# Patient Record
Sex: Female | Born: 1990
Health system: Southern US, Community
[De-identification: ages and names within clinical notes are randomized; demographics above are authoritative.]

## PROBLEM LIST (undated history)

## (undated) DIAGNOSIS — N390 Urinary tract infection, site not specified: Secondary | ICD-10-CM

## (undated) DIAGNOSIS — K589 Irritable bowel syndrome without diarrhea: Secondary | ICD-10-CM

## (undated) DIAGNOSIS — K219 Gastro-esophageal reflux disease without esophagitis: Secondary | ICD-10-CM

## (undated) DIAGNOSIS — F32A Depression, unspecified: Secondary | ICD-10-CM

## (undated) DIAGNOSIS — T7840XA Allergy, unspecified, initial encounter: Secondary | ICD-10-CM

## (undated) DIAGNOSIS — F419 Anxiety disorder, unspecified: Secondary | ICD-10-CM

## (undated) DIAGNOSIS — G43909 Migraine, unspecified, not intractable, without status migrainosus: Secondary | ICD-10-CM

## (undated) HISTORY — DX: Allergy, unspecified, initial encounter: T78.40XA

## (undated) HISTORY — DX: Irritable bowel syndrome, unspecified: K58.9

## (undated) HISTORY — DX: Urinary tract infection, site not specified: N39.0

## (undated) HISTORY — DX: Anxiety disorder, unspecified: F41.9

## (undated) HISTORY — DX: Gastro-esophageal reflux disease without esophagitis: K21.9

## (undated) HISTORY — DX: Migraine, unspecified, not intractable, without status migrainosus: G43.909

## (undated) HISTORY — DX: Depression, unspecified: F32.A

---
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
## 2012-08-23 ENCOUNTER — Other Ambulatory Visit (HOSPITAL_COMMUNITY): Payer: Self-pay | Admitting: Family Medicine

## 2012-08-23 ENCOUNTER — Ambulatory Visit (HOSPITAL_COMMUNITY)
Admission: RE | Admit: 2012-08-23 | Discharge: 2012-08-23 | Disposition: A | Discharge status: Home / Self Care | Payer: PRIVATE HEALTH INSURANCE | Source: Ambulatory Visit | Attending: Family Medicine | Admitting: Family Medicine

## 2012-08-23 DIAGNOSIS — IMO0002 Reserved for concepts with insufficient information to code with codable children: Secondary | ICD-10-CM | POA: Insufficient documentation

## 2012-08-23 DIAGNOSIS — R072 Precordial pain: Secondary | ICD-10-CM | POA: Insufficient documentation

## 2012-08-23 DIAGNOSIS — Y9323 Activity, snow (alpine) (downhill) skiing, snow boarding, sledding, tobogganing and snow tubing: Secondary | ICD-10-CM | POA: Insufficient documentation

## 2012-08-23 DIAGNOSIS — R52 Pain, unspecified: Secondary | ICD-10-CM

## 2012-08-23 DIAGNOSIS — S298XXA Other specified injuries of thorax, initial encounter: Secondary | ICD-10-CM | POA: Insufficient documentation

## 2012-10-21 ENCOUNTER — Ambulatory Visit: Payer: Self-pay | Admitting: Family Medicine

## 2012-10-22 ENCOUNTER — Encounter: Payer: Self-pay | Admitting: Family Medicine

## 2012-10-22 ENCOUNTER — Ambulatory Visit (INDEPENDENT_AMBULATORY_CARE_PROVIDER_SITE_OTHER): Payer: PRIVATE HEALTH INSURANCE | Admitting: Family Medicine

## 2012-10-22 VITALS — BP 110/68 | Temp 97.7°F | Wt 180.0 lb

## 2012-10-22 DIAGNOSIS — R300 Dysuria: Secondary | ICD-10-CM

## 2012-10-22 DIAGNOSIS — R21 Rash and other nonspecific skin eruption: Secondary | ICD-10-CM

## 2012-10-22 DIAGNOSIS — N390 Urinary tract infection, site not specified: Secondary | ICD-10-CM

## 2012-10-22 LAB — POCT URINALYSIS DIPSTICK: Spec Grav, UA: 1.02

## 2012-10-22 MED ORDER — CIPROFLOXACIN HCL 500 MG PO TABS: 500.0000 mg | ORAL_TABLET | Freq: Two times a day (BID) | ORAL | Status: AC

## 2012-10-22 MED ORDER — TRIAMCINOLONE ACETONIDE 0.1 % EX CREA: TOPICAL_CREAM | Freq: Two times a day (BID) | CUTANEOUS | Status: DC

## 2012-10-22 NOTE — Progress Notes (Signed)
  Subjective:    Patient ID: Brandi Oconnell, female    DOB: 04/01/1991, 22 y.o.   MRN: [id number]  Urinary Tract Infection  This is a new problem. The current episode started in the past 7 days. The problem occurs every urination. The problem has been gradually worsening. The quality of the pain is described as burning. The pain is at a severity of 7/10. The pain is severe. There has been no fever. Associated symptoms include frequency and hesitancy. Pertinent negatives include no chills or nausea. She has tried increased fluids and home medications for the symptoms. The treatment provided mild relief.   Patient also notes rash. Very pruritic. Comes up as tiny bumps on the fingers. Unresponsive to over-the-counter agents.   Review of Systems  Constitutional: Negative for chills.  Gastrointestinal: Negative for nausea.  Genitourinary: Positive for hesitancy and frequency.   ROS otherwise negative.    Objective:   Physical Exam  Alert no acute distress. Lungs clear. Heart regular in rhythm. No CVA tenderness. Hands positive dyshidrotic eczema changes  UA 4-6 white blood cells per high-power field    Assessment & Plan:  Impression #1 UTI. #2 dyshidrotic eczema. Plan Cipro twice a day for 7 days. Triamcinolone cream twice a day to affected area. Symptomatic care discussed. WSL

## 2012-11-04 HISTORY — PX: WISDOM TOOTH EXTRACTION: SHX21

## 2012-12-02 ENCOUNTER — Encounter: Payer: Self-pay | Admitting: Family Medicine

## 2012-12-02 ENCOUNTER — Ambulatory Visit (INDEPENDENT_AMBULATORY_CARE_PROVIDER_SITE_OTHER): Payer: PRIVATE HEALTH INSURANCE | Admitting: Family Medicine

## 2012-12-02 VITALS — BP 102/70 | Temp 98.4°F | Ht 65.0 in | Wt 177.2 lb

## 2012-12-02 DIAGNOSIS — K2970 Gastritis, unspecified, without bleeding: Secondary | ICD-10-CM | POA: Insufficient documentation

## 2012-12-02 DIAGNOSIS — K2990 Gastroduodenitis, unspecified, without bleeding: Secondary | ICD-10-CM

## 2012-12-02 DIAGNOSIS — R1013 Epigastric pain: Secondary | ICD-10-CM

## 2012-12-02 MED ORDER — PANTOPRAZOLE SODIUM 40 MG PO TBEC: 40.0000 mg | DELAYED_RELEASE_TABLET | Freq: Every day | ORAL | Status: DC

## 2012-12-02 NOTE — Progress Notes (Signed)
  Subjective:    Patient ID: Brandi Oconnell, female    DOB: 05-25-1991, 22 y.o.   MRN: [id number]  Abdominal Pain This is a new problem. The current episode started in the past 7 days. The onset quality is sudden. The problem occurs intermittently. The problem has been gradually worsening. The pain is located in the generalized abdominal region. The pain is at a severity of 10/10. The pain is severe. The quality of the pain is aching and sharp. The abdominal pain does not radiate. Associated symptoms include nausea. Pertinent negatives include no fever or vomiting. Nothing aggravates the pain. The pain is relieved by nothing. She has tried nothing for the symptoms. past history of stomach ulcer   Weds am awaken form sleep. Severe pain, kept her awake. Pain throughout the day, worse by 4 pm.  Had nausea.  Review of Systems  Constitutional: Negative for fever and fatigue.  HENT: Negative for congestion.   Respiratory: Negative for choking.   Gastrointestinal: Positive for nausea and abdominal pain. Negative for vomiting.  nsaids rarely.      Objective:   Physical Exam  Constitutional: She appears well-developed.  HENT:  Head: Normocephalic.  Cardiovascular: Normal rate, regular rhythm and normal heart sounds.   Pulmonary/Chest: Effort normal and breath sounds normal.  Abdominal: Soft. She exhibits no mass. There is tenderness (epigastric). There is no rebound and no guarding.  Lymphadenopathy:    She has no cervical adenopathy.          Assessment & Plan:  Gastritis- check labs, protonix 8 weeks, if worse f/u, EGD if worse

## 2012-12-02 NOTE — Patient Instructions (Signed)
  Do your lab work Take the protonix one daily for 2 months If worse need to let us know    Gastritis, Adult Gastritis is soreness and swelling (inflammation) of the lining of the stomach. Gastritis can develop as a sudden onset (acute) or long-term (chronic) condition. If gastritis is not treated, it can lead to stomach bleeding and ulcers. CAUSES  Gastritis occurs when the stomach lining is weak or damaged. Digestive juices from the stomach then inflame the weakened stomach lining. The stomach lining may be weak or damaged due to viral or bacterial infections. One common bacterial infection is the Helicobacter pylori infection. Gastritis can also result from excessive alcohol consumption, taking certain medicines, or having too much acid in the stomach.  SYMPTOMS  In some cases, there are no symptoms. When symptoms are present, they may include:  Pain or a burning sensation in the upper abdomen.  Nausea.  Vomiting.  An uncomfortable feeling of fullness after eating. DIAGNOSIS  Your caregiver may suspect you have gastritis based on your symptoms and a physical exam. To determine the cause of your gastritis, your caregiver may perform the following:  Blood or stool tests to check for the H pylori bacterium.  Gastroscopy. A thin, flexible tube (endoscope) is passed down the esophagus and into the stomach. The endoscope has a light and camera on the end. Your caregiver uses the endoscope to view the inside of the stomach.  Taking a tissue sample (biopsy) from the stomach to examine under a microscope. TREATMENT  Depending on the cause of your gastritis, medicines may be prescribed. If you have a bacterial infection, such as an H pylori infection, antibiotics may be given. If your gastritis is caused by too much acid in the stomach, H2 blockers or antacids may be given. Your caregiver may recommend that you stop taking aspirin, ibuprofen, or other nonsteroidal anti-inflammatory drugs  (NSAIDs). HOME CARE INSTRUCTIONS  Only take over-the-counter or prescription medicines as directed by your caregiver.  If you were given antibiotic medicines, take them as directed. Finish them even if you start to feel better.  Drink enough fluids to keep your urine clear or pale yellow.  Avoid foods and drinks that make your symptoms worse, such as:  Caffeine or alcoholic drinks.  Chocolate.  Peppermint or mint flavorings.  Garlic and onions.  Spicy foods.  Citrus fruits, such as oranges, lemons, or limes.  Tomato-based foods such as sauce, chili, salsa, and pizza.  Fried and fatty foods.  Eat small, frequent meals instead of large meals. SEEK IMMEDIATE MEDICAL CARE IF:   You have black or dark red stools.  You vomit blood or material that looks like coffee grounds.  You are unable to keep fluids down.  Your abdominal pain gets worse.  You have a fever.  You do not feel better after 1 week.  You have any other questions or concerns. MAKE SURE YOU:  Understand these instructions.  Will watch your condition.  Will get help right away if you are not doing well or get worse. Document Released: 06/17/2001 Document Revised: 12/23/2011 Document Reviewed: 08/06/2011 [facility] Patient Information 2014 [place], [place].

## 2012-12-09 LAB — CBC WITH DIFFERENTIAL/PLATELET
Eosinophils Absolute: 0.5 10*3/uL (ref 0.0–0.7)
Eosinophils Relative: 6 % — ABNORMAL HIGH (ref 0–5)
HCT: 43.1 % (ref 36.0–46.0)
Hemoglobin: 14.9 g/dL (ref 12.0–15.0)
Lymphocytes Relative: 27 % (ref 12–46)
Lymphs Abs: 2.2 10*3/uL (ref 0.7–4.0)
MCH: 29 pg (ref 26.0–34.0)
MCV: 83.9 fL (ref 78.0–100.0)
Monocytes Absolute: 0.9 10*3/uL (ref 0.1–1.0)
Monocytes Relative: 11 % (ref 3–12)
Platelets: 340 10*3/uL (ref 150–400)
RBC: 5.14 MIL/uL — ABNORMAL HIGH (ref 3.87–5.11)

## 2012-12-09 LAB — HEPATIC FUNCTION PANEL
ALT: 31 U/L (ref 0–35)
AST: 20 U/L (ref 0–37)
Alkaline Phosphatase: 74 U/L (ref 39–117)
Bilirubin, Direct: 0.1 mg/dL (ref 0.0–0.3)
Indirect Bilirubin: 0.4 mg/dL (ref 0.0–0.9)
Total Protein: 6.8 g/dL (ref 6.0–8.3)

## 2013-04-27 ENCOUNTER — Ambulatory Visit (INDEPENDENT_AMBULATORY_CARE_PROVIDER_SITE_OTHER): Payer: PRIVATE HEALTH INSURANCE | Admitting: Advanced Practice Midwife

## 2013-04-27 ENCOUNTER — Encounter: Payer: Self-pay | Admitting: Advanced Practice Midwife

## 2013-04-27 ENCOUNTER — Encounter (INDEPENDENT_AMBULATORY_CARE_PROVIDER_SITE_OTHER): Payer: Self-pay

## 2013-04-27 VITALS — BP 112/84 | Ht 65.0 in | Wt 179.0 lb

## 2013-04-27 DIAGNOSIS — Z3202 Encounter for pregnancy test, result negative: Secondary | ICD-10-CM

## 2013-04-27 DIAGNOSIS — Z3049 Encounter for surveillance of other contraceptives: Secondary | ICD-10-CM

## 2013-04-27 DIAGNOSIS — Z309 Encounter for contraceptive management, unspecified: Secondary | ICD-10-CM | POA: Insufficient documentation

## 2013-04-27 NOTE — Progress Notes (Signed)
Filed Vitals:   04/27/13 1445  BP: 112/84  Has been on Seasonique for 3 years.  Has been having BTB over the past few months, no rhyme or reason. Does not miss any pills. Discussed options.  Will stay on Seasonique, but schedule a BTB every month ( 4 days off q 24) for 3 months.  If this does not help, consider increase her estrogen.  Pt is agreeable.

## 2013-07-04 ENCOUNTER — Other Ambulatory Visit: Payer: Self-pay | Admitting: Family Medicine

## 2013-07-14 ENCOUNTER — Ambulatory Visit (INDEPENDENT_AMBULATORY_CARE_PROVIDER_SITE_OTHER): Payer: PRIVATE HEALTH INSURANCE | Admitting: Nurse Practitioner

## 2013-07-14 ENCOUNTER — Encounter: Payer: Self-pay | Admitting: Nurse Practitioner

## 2013-07-14 VITALS — BP 122/70 | Temp 98.5°F | Ht 65.75 in | Wt 185.0 lb

## 2013-07-14 DIAGNOSIS — K5904 Chronic idiopathic constipation: Secondary | ICD-10-CM

## 2013-07-14 DIAGNOSIS — K5900 Constipation, unspecified: Secondary | ICD-10-CM

## 2013-07-14 MED ORDER — LUBIPROSTONE 24 MCG PO CAPS: 24.0000 ug | ORAL_CAPSULE | Freq: Two times a day (BID) | ORAL | Status: DC

## 2013-07-15 ENCOUNTER — Encounter: Payer: Self-pay | Admitting: Nurse Practitioner

## 2013-07-15 DIAGNOSIS — K5904 Chronic idiopathic constipation: Secondary | ICD-10-CM | POA: Insufficient documentation

## 2013-07-15 NOTE — Assessment & Plan Note (Signed)
Restart Amitiza 24 mcg twice a day. Increase fiber in diet. Adequate fluid intake. Reviewed plan in case severe constipation occurs. Warning signs reviewed. Call back if symptoms worsen or persist.

## 2013-07-15 NOTE — Progress Notes (Signed)
Subjective:  Presents with complaints of a flareup of her constipation over the past month. Has a history of chronic constipation which will worsen intermittently. Takes daily probiotic. Reflux well controlled with Protonix. No nausea or vomiting. Took Amitiza in 2012 which worked well. Her last BM was 3 days ago, small soft stool. Has cramping and bloating only with bowel movements. Otherwise no abdominal pain. Rare bright red rectal bleeding with wiping only with large stools. No fevers. No urinary symptoms. Has trouble getting in adequate fiber to her diet. Has not identified any specific triggers.  Objective:   BP 122/70  Temp(Src) 98.5 F (36.9 C) (Oral)  Ht 5' 5.75" (1.67 m)  Wt 185 lb (83.915 kg)  BMI 30.09 kg/m2 NAD. Alert, oriented. Lungs clear. Heart regular rate rhythm. Abdomen mildly distended with hypoactive bowel sounds x4; nontender to palpation. No obvious masses.  Assessment:Chronic idiopathic constipation  Plan: Restart Amitiza 24 mcg twice a day. Increase fiber in diet. Adequate fluid intake. Reviewed plan in case severe constipation occurs. Warning signs reviewed. Call back if symptoms worsen or persist.

## 2013-08-18 ENCOUNTER — Telehealth: Payer: Self-pay | Admitting: Family Medicine

## 2013-08-18 NOTE — Telephone Encounter (Signed)
Stop the amitiza (given for constip)and start high dose miralax one scoop bid for constip

## 2013-08-18 NOTE — Telephone Encounter (Signed)
Patient says that she thinks that the Amitiza that she was prescribed for her migraines is causing her to have them more frequently. Please advise.

## 2013-08-18 NOTE — Telephone Encounter (Signed)
Notified patient stop the amitiza (given for constip) and start high dose miralax one scoop bid for constip. Patient verbalized understanding.

## 2013-08-22 ENCOUNTER — Telehealth: Payer: Self-pay | Admitting: Family Medicine

## 2013-08-22 NOTE — Telephone Encounter (Signed)
miralax can be taken safely 365 days per yr in someone prone to constipation. They write that so folks who have a serious cause (extremely rare at this age) do not keep self treating without contacting their physician  Get otc dulcolax tablets and take four this eve  Then get Milk of magnesia and take 30 cc's by mouth,..may repeat both of these tomorrow if necessary  If constip persists, rec o v

## 2013-08-22 NOTE — Telephone Encounter (Signed)
Patient was told to take Miralax for her constipation last week. She has concerns on this. She has not had a bowel movement since Friday. The back of the bottle states do not take more than 7 days. Please advise.

## 2013-08-22 NOTE — Telephone Encounter (Signed)
Discussed with patient. Patient verbalized understanding. 

## 2013-08-26 ENCOUNTER — Encounter: Payer: Self-pay | Admitting: Family Medicine

## 2013-08-26 ENCOUNTER — Ambulatory Visit (INDEPENDENT_AMBULATORY_CARE_PROVIDER_SITE_OTHER): Payer: PRIVATE HEALTH INSURANCE | Admitting: Family Medicine

## 2013-08-26 VITALS — BP 120/80 | Temp 98.4°F | Ht 65.75 in | Wt 189.4 lb

## 2013-08-26 DIAGNOSIS — K5904 Chronic idiopathic constipation: Secondary | ICD-10-CM

## 2013-08-26 DIAGNOSIS — K219 Gastro-esophageal reflux disease without esophagitis: Secondary | ICD-10-CM

## 2013-08-26 DIAGNOSIS — K5900 Constipation, unspecified: Secondary | ICD-10-CM

## 2013-08-26 MED ORDER — HYOSCYAMINE SULFATE 0.125 MG SL SUBL: 0.1250 mg | SUBLINGUAL_TABLET | Freq: Four times a day (QID) | SUBLINGUAL | Status: DC | PRN

## 2013-08-26 MED ORDER — SUCRALFATE 1 G PO TABS: ORAL_TABLET | ORAL | Status: DC

## 2013-08-26 NOTE — Patient Instructions (Signed)
Double up on the miralax for one weeks

## 2013-08-26 NOTE — Progress Notes (Signed)
   Subjective:    Patient ID: Brandi LeechKristin E Oconnell, female    DOB: 10/01/1990, 23 y.o.   MRN: [id number]  Abdominal Pain This is a new problem. The current episode started today. The onset quality is sudden. The problem occurs intermittently. The problem has been unchanged. The pain is located in the generalized abdominal region. The pain is at a severity of 8/10. The pain is moderate. The quality of the pain is cramping. The abdominal pain does not radiate. Nothing aggravates the pain. The pain is relieved by nothing. She has tried proton pump inhibitors for the symptoms. The treatment provided no relief.   Patient has also had some reflux symptoms. At times burning and belching. Positive history of reflux in the past. Has not had her see a gastroenterologist.  Experiencing cramping. See prior notes. Associated with protracted constipation and only diarrhea in the last couple days. amitiza worsened the constip  Review of Systems  Gastrointestinal: Positive for abdominal pain.   no fever no chills no change in urinary habits ROS otherwise negative     Objective:   Physical Exam Alert no acute distress. Vitals stable. Lungs clear. Heart rare rhythm. Epigastrium perhaps slight tenderness no rebound no guarding diffuse mild tenderness very good bowel sounds       Assessment & Plan:  Impression 1 flare of reflux. #2 chronic constipation. Plan increase MiraLAX once again. Patient may have upper: Constipation with loose stools discussed. Plan increase MiraLAX to twice a day. Levsin SL when necessary for cramping. Symptomatic care discussed. Add Carafate a.c. and at bedtime next couple weeks. Expect slow resolution.

## 2013-08-27 DIAGNOSIS — K219 Gastro-esophageal reflux disease without esophagitis: Secondary | ICD-10-CM | POA: Insufficient documentation

## 2013-09-20 ENCOUNTER — Ambulatory Visit: Payer: PRIVATE HEALTH INSURANCE | Admitting: Family Medicine

## 2013-09-26 IMAGING — CR DG CHEST 2V
2 series · 2 of 2 positions shown · non-contrast
Comparison: 12/25/2011

CLINICAL DATA: Sledding accident.  Sternal pain

CHEST - 2 VIEW

[view not recorded (1 of 2)]
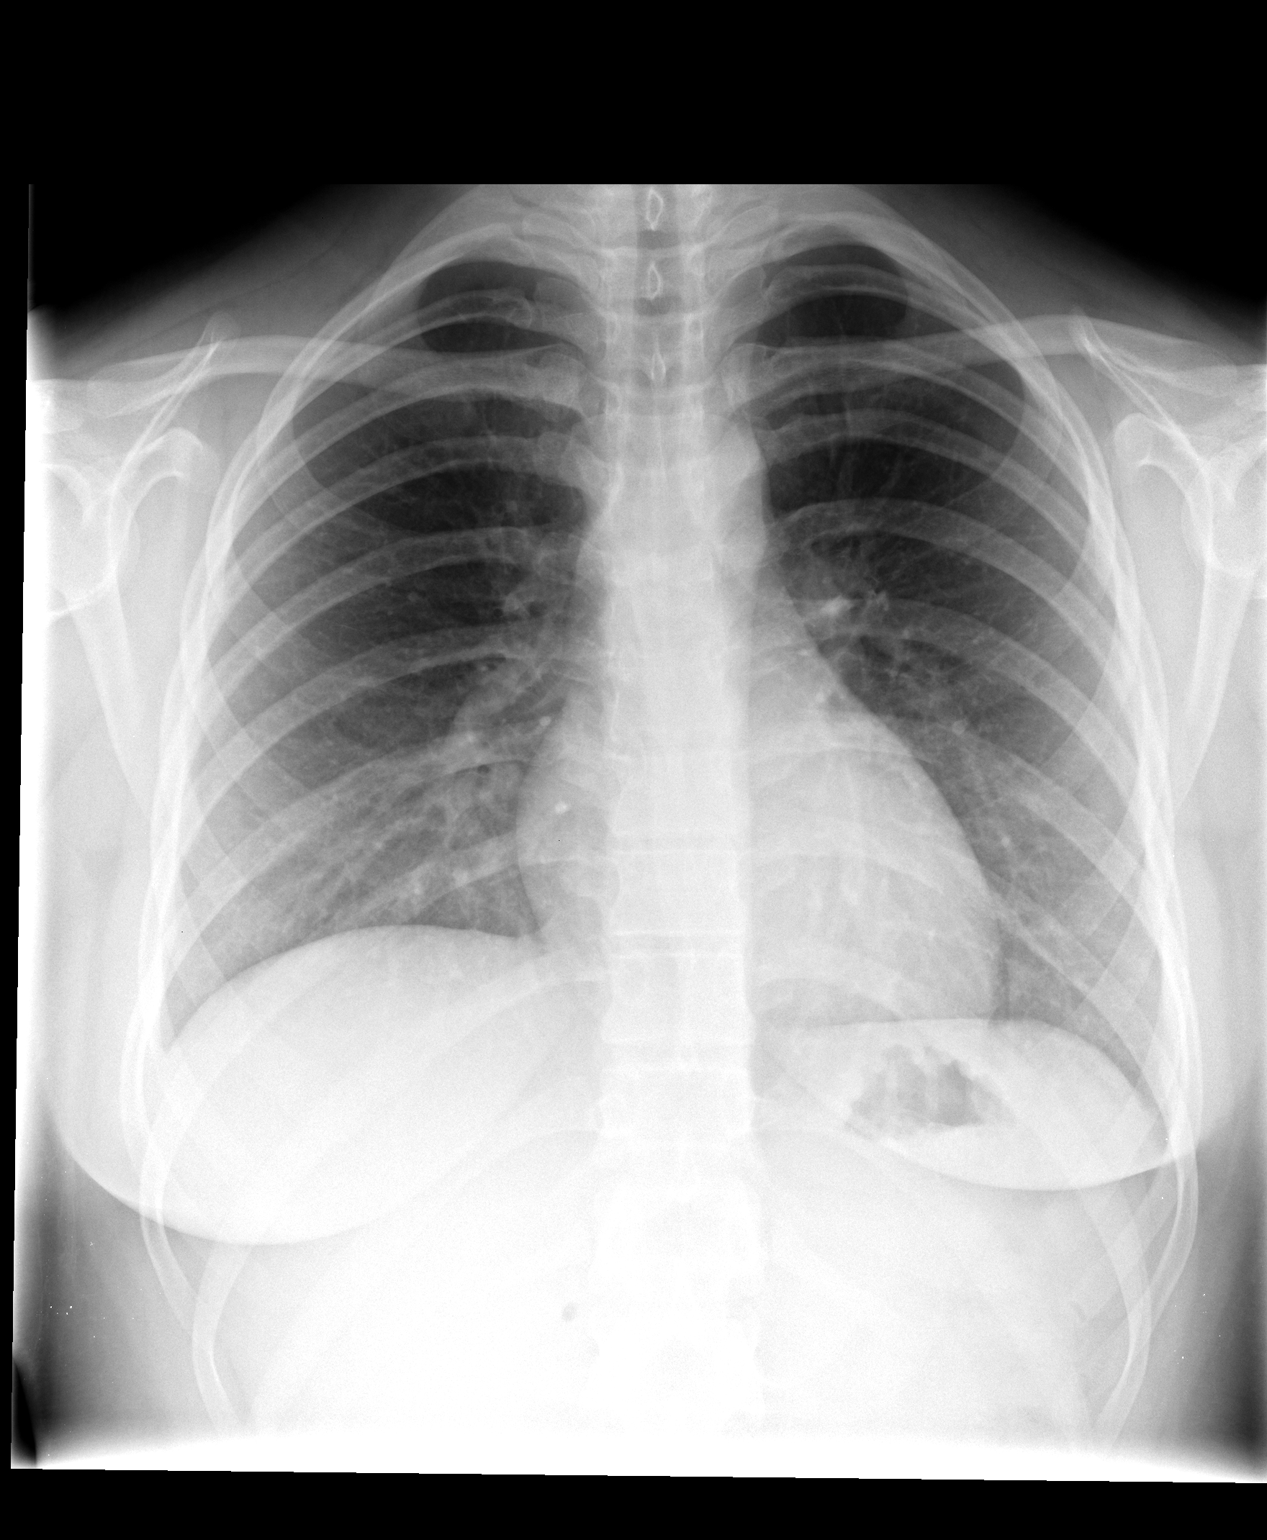

[view not recorded (2 of 2)]
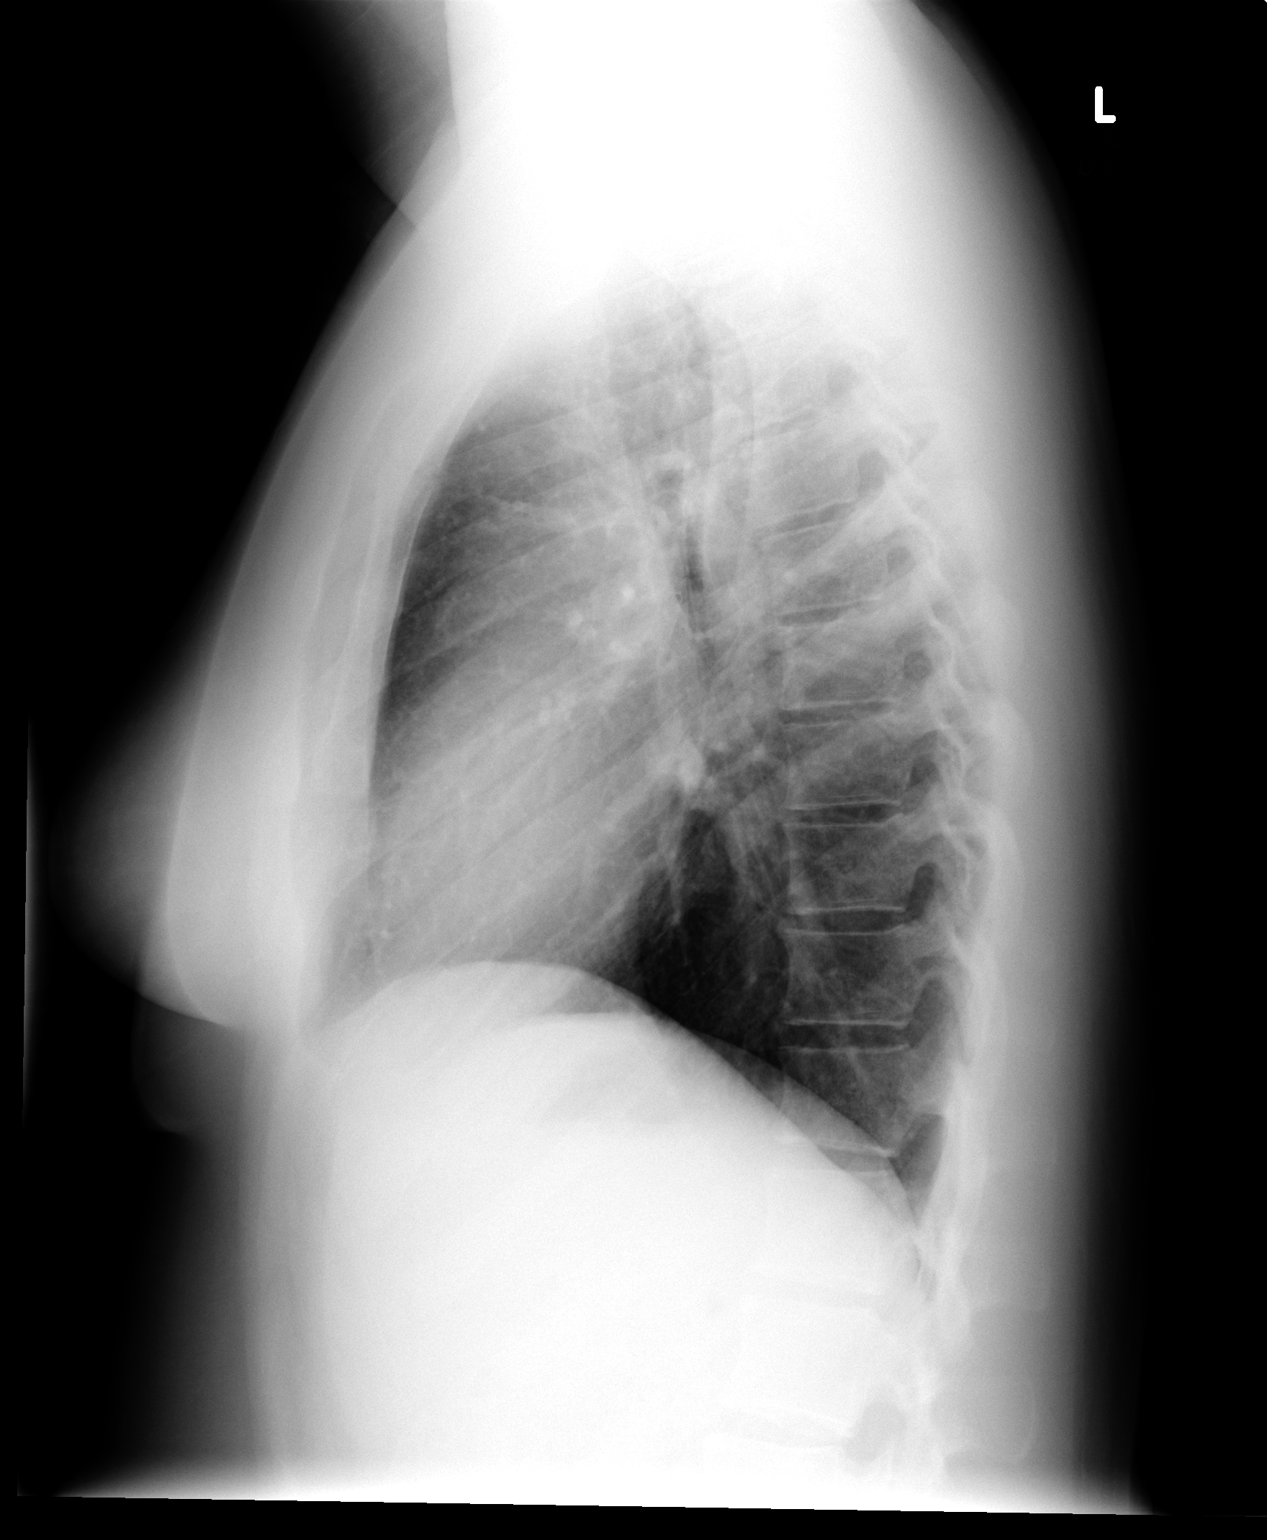

[2 of 2 positions shown; findings below may reference images not displayed]

FINDINGS: Cardiac and mediastinal contours are normal.  Lungs are
clear without infiltrate or effusion.  Negative for pneumothorax.

Negative for rib fracture.  No displaced sternal fracture is
identified.
IMPRESSION: Negative

## 2014-03-20 ENCOUNTER — Encounter: Payer: Self-pay | Admitting: Family Medicine

## 2014-03-20 ENCOUNTER — Ambulatory Visit (INDEPENDENT_AMBULATORY_CARE_PROVIDER_SITE_OTHER): Payer: PRIVATE HEALTH INSURANCE | Admitting: Family Medicine

## 2014-03-20 VITALS — BP 124/80 | Temp 99.2°F | Ht 65.75 in | Wt 188.0 lb

## 2014-03-20 DIAGNOSIS — N390 Urinary tract infection, site not specified: Secondary | ICD-10-CM

## 2014-03-20 LAB — POCT URINALYSIS DIPSTICK: pH, UA: 6

## 2014-03-20 MED ORDER — CIPROFLOXACIN HCL 250 MG PO TABS: ORAL_TABLET | ORAL | Status: DC

## 2014-03-20 NOTE — Patient Instructions (Signed)
Take one of the cipro 250 twice per day for five days for the active infxn  Take one cipro as needed when sexually active , and be sure to urinate before having sex

## 2014-03-20 NOTE — Progress Notes (Signed)
   Subjective:    Patient ID: Brandi Oconnell, female    DOB: July 13, 1990, 23 y.o.   MRN: [id number]  Urinary Tract Infection  This is a new problem. The current episode started in the past 7 days. The problem occurs intermittently. The quality of the pain is described as burning. The pain is at a severity of 1/10. The pain is mild. Maximum temperature: 99.2. The fever has been present for 3 - 4 days. She is sexually active. She has tried home medications and increased fluids for the symptoms. The treatment provided no relief.   Friday stared  Suddenly  Took otc azo and took extra cran juice and vit c  Low gr temp, felt flushed all nweekend,  diminshed energy  Seems to increase after having sex  Empties bladder bef having sex  Low abd tenderness, now     Review of Systems No headache no chest pain no fever no chills ROS otherwise negative    Objective:   Physical Exam Alert no acute distress. Lungs clear. Heart rare rhythm no CA tenderness  Urinalysis 2-4 white blood cells per high-power field       Assessment & Plan:  Impression urinary tract infection. Plan Cipro 250 twice a day 5 days. May take one by mouth when necessary when having sex to reduce incidence of UTI discussed. Also be sure it's empty bladder before sexual relations. WSL

## 2014-04-17 ENCOUNTER — Telehealth: Payer: Self-pay | Admitting: Family Medicine

## 2014-04-17 ENCOUNTER — Encounter: Payer: Self-pay | Admitting: Family Medicine

## 2014-04-17 ENCOUNTER — Ambulatory Visit (INDEPENDENT_AMBULATORY_CARE_PROVIDER_SITE_OTHER): Payer: PRIVATE HEALTH INSURANCE | Admitting: Family Medicine

## 2014-04-17 VITALS — Temp 100.0°F | Ht 65.75 in | Wt 187.0 lb

## 2014-04-17 DIAGNOSIS — J329 Chronic sinusitis, unspecified: Secondary | ICD-10-CM

## 2014-04-17 MED ORDER — BENZONATATE 100 MG PO CAPS: 100.0000 mg | ORAL_CAPSULE | Freq: Four times a day (QID) | ORAL | Status: DC | PRN

## 2014-04-17 MED ORDER — AZITHROMYCIN 250 MG PO TABS: ORAL_TABLET | ORAL | Status: DC

## 2014-04-17 NOTE — Progress Notes (Signed)
   Subjective:    Patient ID: Brandi Oconnell, female    DOB: 12/13/1990, 23 y.o.   MRN: [id number]  Fever  This is a new problem. The current episode started today. Associated symptoms include congestion, coughing, headaches and muscle aches. She has tried NSAIDs for the symptoms.    Sudden onset  Diffuse achiness  Just yest exp to some sick folks   Working at [employer]    Felt congested not true wheezing     Review of Systems  Constitutional: Positive for fever.  HENT: Positive for congestion.   Respiratory: Positive for cough.   Neurological: Positive for headaches.       Objective:   Physical Exam  Alert moderate malaise. Temperature 101. Vitals otherwise stable. HEENT nasal congestion pharynx moderate erythema neck supple. Lungs bronchial cough heart regular in rhythm.      Assessment & Plan:  Impression viral syndrome similar to parainfluenza with secondary rhinosinusitis plan antibiotics prescribed. Symptomatic care discussed. Tessalon when necessary. WSL

## 2014-04-17 NOTE — Telephone Encounter (Signed)
error 

## 2014-05-19 ENCOUNTER — Telehealth: Payer: Self-pay | Admitting: Family Medicine

## 2014-05-19 ENCOUNTER — Other Ambulatory Visit: Payer: Self-pay | Admitting: Nurse Practitioner

## 2014-05-19 MED ORDER — TRIAMCINOLONE ACETONIDE 0.1 % EX CREA: TOPICAL_CREAM | Freq: Two times a day (BID) | CUTANEOUS | Status: DC

## 2014-05-19 NOTE — Telephone Encounter (Signed)
Patient needs Rx for triamcinolone cream (KENALOG) 0.1 %   [facility]new pharmacy

## 2014-06-15 ENCOUNTER — Ambulatory Visit: Payer: PRIVATE HEALTH INSURANCE | Admitting: Family Medicine

## 2014-07-18 ENCOUNTER — Ambulatory Visit (INDEPENDENT_AMBULATORY_CARE_PROVIDER_SITE_OTHER): Payer: PRIVATE HEALTH INSURANCE | Admitting: Family Medicine

## 2014-07-18 ENCOUNTER — Encounter: Payer: Self-pay | Admitting: Family Medicine

## 2014-07-18 VITALS — BP 128/90 | Ht 65.0 in | Wt 193.0 lb

## 2014-07-18 DIAGNOSIS — M79602 Pain in left arm: Secondary | ICD-10-CM

## 2014-07-18 DIAGNOSIS — K219 Gastro-esophageal reflux disease without esophagitis: Secondary | ICD-10-CM

## 2014-07-18 MED ORDER — RANITIDINE HCL 300 MG PO TABS: 300.0000 mg | ORAL_TABLET | Freq: Every day | ORAL | Status: DC

## 2014-07-18 NOTE — Progress Notes (Signed)
   Subjective:    Patient ID: Brandi LeechKristin E Oconnell, female    DOB: 05/14/1991, 24 y.o.   MRN: [id number]  Wrist Pain  The pain is present in the left wrist, left shoulder, left arm and left elbow. This is a new problem. The current episode started more than 1 month ago. The quality of the pain is described as burning. Associated symptoms include tingling. She has tried NSAIDS for the symptoms.   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       Objective:   Physical Exam Alert no apparent distress. Vitals stable lungs clear. Heart regular in rhythm. Left shoulder excellent range of motion. Negative Tinel sign negative Phalen sign. Abdomen benign.       Assessment & Plan:  Impression progressive reflux discussed #2 nonspecific left arm pain. Likely related to redundant activity at work. Plan encouraged to follow-up with work comp doctor regarding this. Anti-inflammatory medicine as directed. Initiate Protonix. 4. Reflux. WSL

## 2014-08-15 ENCOUNTER — Encounter: Payer: Self-pay | Admitting: Family Medicine

## 2014-08-15 ENCOUNTER — Ambulatory Visit (INDEPENDENT_AMBULATORY_CARE_PROVIDER_SITE_OTHER): Payer: PRIVATE HEALTH INSURANCE | Admitting: Family Medicine

## 2014-08-15 VITALS — BP 122/80 | Ht 65.0 in | Wt 194.0 lb

## 2014-08-15 DIAGNOSIS — M25561 Pain in right knee: Secondary | ICD-10-CM

## 2014-08-15 DIAGNOSIS — M25562 Pain in left knee: Secondary | ICD-10-CM

## 2014-08-15 MED ORDER — ETODOLAC 400 MG PO TABS: 400.0000 mg | ORAL_TABLET | Freq: Two times a day (BID) | ORAL | Status: DC

## 2014-08-15 NOTE — Progress Notes (Signed)
   Subjective:    Patient ID: Brandi Oconnell, female    DOB: 04/22/1991, 24 y.o.   MRN: [id number]  Knee Pain  The incident occurred 3 to 5 days ago. The pain is present in the left knee and right knee. She has tried rest (anti inflammatory) for the symptoms. The treatment provided no relief.    Pt was at work Saturday and knees got to hurting bad  Felt tight and uncomfortable  Excuse given sat and sun, recalls no activity , no sig crouching  Knees have acted up in the past, 2013 cortsone injec in one knees, management of discomfort  Pain is a lot better, took aleave on sat and that felt better  Review of Systems No headache no chest pain no back pain no abdominal pain no headache pain reflux now improved    Objective:   Physical Exam  Alert vital stable no acute distress lungs clear heart rare rhythm anterior knee slight tenderness deep palpation. No effusion no joint laxity good range of motion      Assessment & Plan:  Impression bilateral knee pain prepatellar in nature discussed plan Lodine with food 3 times a day when necessary. Work excuse written. Local measures discussed. No x-rays rationale discussed. WSL

## 2014-09-06 ENCOUNTER — Other Ambulatory Visit: Payer: Self-pay | Admitting: *Deleted

## 2014-09-06 ENCOUNTER — Telehealth: Payer: Self-pay | Admitting: Family Medicine

## 2014-09-06 MED ORDER — LEVONORGEST-ETH ESTRAD 91-DAY 0.15-0.03 &0.01 MG PO TABS: 1.0000 | ORAL_TABLET | Freq: Every day | ORAL | Status: DC

## 2014-09-06 NOTE — Telephone Encounter (Signed)
Pt is needing a [facility] on [facility] [place]

## 2014-09-06 NOTE — Telephone Encounter (Signed)
Notified patient that she needs a well woman exam. She has not had one in over 2 years. Pt said she will call and schedule one with either Eber Jonesarolyn or her OBGYN once she calls her insurance co. I told her I will [facility] but that we cannot refill anymore w/o her having an exam. Pt verbalized understanding.

## 2014-09-26 ENCOUNTER — Other Ambulatory Visit: Payer: PRIVATE HEALTH INSURANCE | Admitting: Advanced Practice Midwife

## 2015-04-17 ENCOUNTER — Telehealth: Payer: Self-pay | Admitting: Family Medicine

## 2015-04-17 NOTE — Telephone Encounter (Signed)
Discussed with patient

## 2015-04-17 NOTE — Telephone Encounter (Signed)
Pt over took her pills on Tuesday (forgot she took it when she got home, took again when she went to bed, She took 3 pills that night total)  By Thursday she started feeling nauseated, an spotting started yesterday afternoon. 04/16/15  She wasn't to know if she should start taking the pill as normal? She stopped taking it for now and is confused  As to what to do? She is not due for her period till the 26th of this month. Thinks she may be pregnant an unsure  She should take the pill till she knows.    [facility]

## 2015-04-17 NOTE — Telephone Encounter (Signed)
She states she took two pills(after sex with spouse that night) because she read on the Internet that It is the equivalent to Plan B pill.  She forgot she had taken the one pill When she got home.

## 2015-09-11 ENCOUNTER — Encounter: Payer: PRIVATE HEALTH INSURANCE | Admitting: Nurse Practitioner

## 2015-09-13 ENCOUNTER — Ambulatory Visit (INDEPENDENT_AMBULATORY_CARE_PROVIDER_SITE_OTHER): Payer: PRIVATE HEALTH INSURANCE | Admitting: Nurse Practitioner

## 2015-09-13 ENCOUNTER — Encounter: Payer: Self-pay | Admitting: Nurse Practitioner

## 2015-09-13 VITALS — BP 110/62 | Ht 65.0 in | Wt 204.0 lb

## 2015-09-13 DIAGNOSIS — Z124 Encounter for screening for malignant neoplasm of cervix: Secondary | ICD-10-CM | POA: Diagnosis not present

## 2015-09-13 DIAGNOSIS — Z0000 Encounter for general adult medical examination without abnormal findings: Secondary | ICD-10-CM

## 2015-09-13 DIAGNOSIS — Z113 Encounter for screening for infections with a predominantly sexual mode of transmission: Secondary | ICD-10-CM | POA: Diagnosis not present

## 2015-09-13 MED ORDER — LEVONORGEST-ETH ESTRAD 91-DAY 0.15-0.03 &0.01 MG PO TABS: 1.0000 | ORAL_TABLET | Freq: Every day | ORAL | Status: DC

## 2015-09-13 NOTE — Progress Notes (Signed)
   Subjective:    Patient ID: Brandi Oconnell, female    DOB: 07/15/1990, 25 y.o.   MRN: [id number]  HPI presents for her wellness exam. Same sexual partner since 2011. Now married. Regular cycles with heavy flow 2-3 days, lasting total of 5 days. Has not been on birth control but would like to restart oc's since she is back in school. Regular vision exams. Plans dental exams once her new insurance starts.     Review of Systems  Constitutional: Negative for fever, activity change, appetite change and fatigue.  HENT: Negative for dental problem, ear pain, sinus pressure and sore throat.   Respiratory: Negative for cough, chest tightness, shortness of breath and wheezing.   Cardiovascular: Negative for chest pain.  Gastrointestinal: Negative for nausea, vomiting, abdominal pain, diarrhea, constipation and abdominal distention.  Genitourinary: Negative for dysuria, urgency, frequency, vaginal discharge, enuresis, difficulty urinating, genital sores, menstrual problem and pelvic pain.       Objective:   Physical Exam  Constitutional: She is oriented to person, place, and time. She appears well-developed. No distress.  HENT:  Right Ear: External ear normal.  Left Ear: External ear normal.  Mouth/Throat: Oropharynx is clear and moist.  Neck: Normal range of motion. Neck supple. No tracheal deviation present. No thyromegaly present.  Cardiovascular: Normal rate, regular rhythm and normal heart sounds.  Exam reveals no gallop.   No murmur heard. Pulmonary/Chest: Effort normal and breath sounds normal.  Abdominal: Soft. She exhibits no distension. There is no tenderness.  Genitourinary: Vagina normal and uterus normal. No vaginal discharge found.  External GU: no rashes or lesions. Vagina: small amount of mucoid discharge with streaks of dark blood. Cervix normal in appearance with small amount of blood at os. No CMT. Bimanual exam: no tenderness or obvious masses.   Musculoskeletal: She  exhibits no edema.  Lymphadenopathy:    She has no cervical adenopathy.  Neurological: She is alert and oriented to person, place, and time.  Skin: Skin is warm and dry. No rash noted.  Psychiatric: She has a normal mood and affect. Her behavior is normal.  Vitals reviewed. Breast exam: dense tissue; mild fine nodularity; no masses; axillae no adenopathy.         Assessment & Plan:  Routine general medical examination at a health care facility - Plan: Pap IG, CT/NG w/ reflex HPV when ASC-U  Screening for cervical cancer - Plan: Pap IG, CT/NG w/ reflex HPV when ASC-U  Screen for STD (sexually transmitted disease) - Plan: Pap IG, CT/NG w/ reflex HPV when ASC-U  Meds ordered this encounter  Medications  . Levonorgestrel-Ethinyl Estradiol (AMETHIA,CAMRESE) 0.15-0.03 &0.01 MG tablet    Sig: Take 1 tablet by mouth daily.    Dispense:  1 Package    Refill:  4    Order Specific Question:  Supervising Provider    Answer:  Riccardo DubinLUKING, WILLIAM S [[id number]]   Start first Sunday after next menses begins. Recommend healthy diet, regular exercise and weight loss.  Return in about 1 year (around 09/12/2016) for physical.

## 2015-09-18 LAB — PAP IG, CT-NG, RFX HPV ASCU
CHLAMYDIA, NUC. ACID AMP: NEGATIVE
GONOCOCCUS BY NUCLEIC ACID AMP: NEGATIVE
PAP Smear Comment: 0

## 2015-10-08 ENCOUNTER — Encounter: Payer: Self-pay | Admitting: Nurse Practitioner

## 2015-10-08 ENCOUNTER — Ambulatory Visit (INDEPENDENT_AMBULATORY_CARE_PROVIDER_SITE_OTHER): Payer: PRIVATE HEALTH INSURANCE | Admitting: Nurse Practitioner

## 2015-10-08 ENCOUNTER — Encounter: Payer: Self-pay | Admitting: Family Medicine

## 2015-10-08 VITALS — BP 122/74 | Temp 99.4°F | Ht 65.0 in | Wt 207.0 lb

## 2015-10-08 DIAGNOSIS — B349 Viral infection, unspecified: Secondary | ICD-10-CM | POA: Diagnosis not present

## 2015-10-08 NOTE — Progress Notes (Signed)
Subjective:  Presents for c/o cough, headache, sore throat and low grade fever that began 3 days ago. Frequent nonproductive cough. Clear runny nose. Generalized headache. Low-grade fever less than 100. No wheezing. Slight chest tightness with cough. No vomiting diarrhea or abdominal pain. Taking fluids well. Voiding normal limit.  Objective:   BP 122/74 mmHg  Temp(Src) 99.4 F (37.4 C) (Oral)  Ht 5\' 5"  (1.651 m)  Wt 207 lb (93.895 kg)  BMI 34.45 kg/m2  LMP 08/08/2015 NAD. Alert, oriented. TMs retracted, no erythema. Pharynx mildly injected. Neck supple mild soft slightly tender anterior adenopathy. Lungs clear. Heart regular rate rhythm.   Assessment: Viral illness  Plan: Reviewed symptomatic care and warning signs for viral illness. Call back in 48-72 hours if no improvement, sooner if worse.

## 2015-10-08 NOTE — Patient Instructions (Signed)
Robitussin CF

## 2015-10-31 ENCOUNTER — Ambulatory Visit: Payer: PRIVATE HEALTH INSURANCE | Admitting: Nurse Practitioner

## 2015-11-14 ENCOUNTER — Ambulatory Visit (INDEPENDENT_AMBULATORY_CARE_PROVIDER_SITE_OTHER): Payer: BLUE CROSS/BLUE SHIELD | Admitting: Nurse Practitioner

## 2015-11-14 ENCOUNTER — Encounter: Payer: Self-pay | Admitting: Nurse Practitioner

## 2015-11-14 VITALS — BP 122/78 | Ht 65.0 in | Wt 207.1 lb

## 2015-11-14 DIAGNOSIS — L509 Urticaria, unspecified: Secondary | ICD-10-CM | POA: Diagnosis not present

## 2015-11-14 MED ORDER — PHENTERMINE HCL 37.5 MG PO TABS: 37.5000 mg | ORAL_TABLET | Freq: Every day | ORAL | Status: DC

## 2015-11-14 NOTE — Progress Notes (Signed)
Subjective:  Presents for c/o localized itching on the inner upper thighs usually occurs at night. No redness or lesions. Relates it to the size of her thighs and rubbing together. Also would like to start med for weight loss. Does not drink soda or sweet tea. Mainly drinks water. Limited activity and diet high in simple carbs other than sugar.   Objective:   BP 122/78 mmHg  Ht 5\' 5"  (1.651 m)  Wt 207 lb 2 oz (93.951 kg)  BMI 34.47 kg/m2 NAD. Alert, oriented. Lungs clear. Heart RRR. Defers exam of thighs since there is no rash or erythema.   Assessment:  Problem List Items Addressed This Visit      Other   Morbid obesity due to excess calories (HCC)   Relevant Medications   phentermine (ADIPEX-P) 37.5 MG tablet    Other Visit Diagnoses    Urticaria    -  Primary      Plan:  Meds ordered this encounter  Medications  . phentermine (ADIPEX-P) 37.5 MG tablet    Sig: Take 1 tablet (37.5 mg total) by mouth daily before breakfast.    Dispense:  30 tablet    Refill:  0    Order Specific Question:  Supervising Provider    Answer:  Merlyn AlbertLUKING, WILLIAM S [[id number]]   Loratadine and zantac as directed for itching. Continue occasional steroid cream use if it helps. Reviewed potential adverse effects of Phentermine. Two goals set: decrease simple carbs in diet and 20 min of activity 4 days per week.   Return in about 1 month (around 12/15/2015).

## 2015-11-14 NOTE — Patient Instructions (Signed)
Loratadine in the am Benadryl at night Zantac as directed

## 2015-11-15 ENCOUNTER — Telehealth: Payer: Self-pay | Admitting: Nurse Practitioner

## 2015-11-15 NOTE — Telephone Encounter (Signed)
Phentermine 37.5 mg tablet is approved from 11/15/15-02/15/16 via [facility] [employer] service.

## 2015-12-13 ENCOUNTER — Ambulatory Visit (INDEPENDENT_AMBULATORY_CARE_PROVIDER_SITE_OTHER): Payer: BLUE CROSS/BLUE SHIELD | Admitting: Nurse Practitioner

## 2015-12-13 ENCOUNTER — Encounter: Payer: Self-pay | Admitting: Nurse Practitioner

## 2015-12-13 VITALS — BP 110/70 | Ht 65.0 in | Wt 197.0 lb

## 2015-12-13 DIAGNOSIS — Z0000 Encounter for general adult medical examination without abnormal findings: Secondary | ICD-10-CM

## 2015-12-13 MED ORDER — PHENTERMINE HCL 37.5 MG PO TABS: 37.5000 mg | ORAL_TABLET | Freq: Every day | ORAL | Status: DC

## 2015-12-15 ENCOUNTER — Encounter: Payer: Self-pay | Admitting: Nurse Practitioner

## 2015-12-15 NOTE — Progress Notes (Signed)
Subjective:  Presents for wellness exam for her new insurance. Patient recently had her women's health physical see previous note. Doing much better with diet. Doing well on phentermine. Denies any adverse effects. No regular activity.  Objective:   BP 110/70 mmHg  Ht 5\' 5"  (1.651 m)  Wt 197 lb (89.359 kg)  BMI 32.78 kg/m2 NAD. Alert, oriented. Lungs clear. Heart regular rate rhythm.  Assessment:  Problem List Items Addressed This Visit      Other   Morbid obesity due to excess calories (HCC)   Relevant Medications   phentermine (ADIPEX-P) 37.5 MG tablet    Other Visit Diagnoses    Routine general medical examination at a health care facility    -  Primary    Relevant Orders    Lipid panel    Hepatic function panel    Basic metabolic panel    TSH    VITAMIN D 25 Hydroxy (Vit-D Deficiency, Fractures)        Plan:  Meds ordered this encounter  Medications  . phentermine (ADIPEX-P) 37.5 MG tablet    Sig: Take 1 tablet (37.5 mg total) by mouth daily before breakfast.    Dispense:  30 tablet    Refill:  2    Order Specific Question:  Supervising Provider    Answer:  Merlyn AlbertLUKING, WILLIAM S [[id number]]   Continue phentermine as directed. Routine lab work ordered. Set a fitness goal of 30-45 minutes of walking most days of the week. Return in about 1 year (around 12/12/2016) for physical. Recheck in 3 months if she wishes to continue weight loss medication.

## 2015-12-18 ENCOUNTER — Telehealth: Payer: Self-pay | Admitting: *Deleted

## 2015-12-18 LAB — TSH: TSH: 2.07 u[IU]/mL (ref 0.450–4.500)

## 2015-12-18 LAB — LIPID PANEL
CHOL/HDL RATIO: 3.4 ratio (ref 0.0–4.4)
CHOLESTEROL TOTAL: 149 mg/dL (ref 100–199)
HDL: 44 mg/dL (ref >39)
LDL CALC: 92 mg/dL (ref 0–99)
TRIGLYCERIDES: 63 mg/dL (ref 0–149)
VLDL CHOLESTEROL CAL: 13 mg/dL (ref 5–40)

## 2015-12-18 LAB — BASIC METABOLIC PANEL
BUN/Creatinine Ratio: 9 (ref 9–23)
BUN: 9 mg/dL (ref 6–20)
CALCIUM: 9.6 mg/dL (ref 8.7–10.2)
CHLORIDE: 99 mmol/L (ref 96–106)
CO2: 21 mmol/L (ref 18–29)
Creatinine, Ser: 1.01 mg/dL — ABNORMAL HIGH (ref 0.57–1.00)
GFR calc Af Amer: 91 mL/min/{1.73_m2} (ref >59)
GFR calc non Af Amer: 79 mL/min/{1.73_m2} (ref >59)
Glucose: 94 mg/dL (ref 65–99)
POTASSIUM: 4 mmol/L (ref 3.5–5.2)
Sodium: 138 mmol/L (ref 134–144)

## 2015-12-18 LAB — VITAMIN D 25 HYDROXY (VIT D DEFICIENCY, FRACTURES): VIT D 25 HYDROXY: 52 ng/mL (ref 30.0–100.0)

## 2015-12-18 LAB — HEPATIC FUNCTION PANEL
ALBUMIN: 4.7 g/dL (ref 3.5–5.5)
ALT: 37 IU/L — AB (ref 0–32)
AST: 19 IU/L (ref 0–40)
Alkaline Phosphatase: 73 IU/L (ref 39–117)
BILIRUBIN TOTAL: 0.6 mg/dL (ref 0.0–1.2)
Bilirubin, Direct: 0.18 mg/dL (ref 0.00–0.40)
Total Protein: 7.3 g/dL (ref 6.0–8.5)

## 2015-12-18 NOTE — Telephone Encounter (Signed)
Form done. Faxed and copy sent to scan and mailed to pt.

## 2015-12-18 NOTE — Telephone Encounter (Signed)
[facility]. Pt's wellness form is complete besides the patient information part. Pt needs to complete.

## 2016-01-10 ENCOUNTER — Telehealth: Payer: Self-pay | Admitting: Family Medicine

## 2016-01-10 MED ORDER — LEVONORGEST-ETH ESTRAD 91-DAY 0.15-0.03 &0.01 MG PO TABS: 1.0000 | ORAL_TABLET | Freq: Every day | ORAL | Status: DC

## 2016-01-10 NOTE — Telephone Encounter (Signed)
Pt went to get her bc filled and her ins is now requiring 90 day supplies. Pt is wanting a 90 day supply called in if possible. Levonorgestrel-Ethinyl Estradiol (AMETHIA,CAMRESE) 0.15-0.03 &0.01 MG tablet      [facility] [place]

## 2016-01-10 NOTE — Telephone Encounter (Signed)
Prescription sent electronically to pharmacy. Patient notified. 

## 2016-01-12 ENCOUNTER — Encounter: Payer: Self-pay | Admitting: Nurse Practitioner

## 2016-02-07 ENCOUNTER — Telehealth: Payer: Self-pay | Admitting: Family Medicine

## 2016-02-07 ENCOUNTER — Other Ambulatory Visit: Payer: Self-pay | Admitting: Nurse Practitioner

## 2016-02-07 MED ORDER — PANTOPRAZOLE SODIUM 40 MG PO TBEC: 40.0000 mg | DELAYED_RELEASE_TABLET | Freq: Every day | ORAL | 1 refills | Status: DC

## 2016-02-07 NOTE — Telephone Encounter (Signed)
Pt is requesting a refill on her pantoprazole (PROTONIX) 40 MG tablet   [facility] [place]

## 2016-02-25 ENCOUNTER — Encounter: Payer: Self-pay | Admitting: Nurse Practitioner

## 2016-02-27 ENCOUNTER — Telehealth: Payer: Self-pay | Admitting: *Deleted

## 2016-02-27 NOTE — Telephone Encounter (Signed)
PA on phentermine was denied. See letter in yellow folder on your desk.

## 2016-02-27 NOTE — Telephone Encounter (Signed)
Left message return call 02/27/16 

## 2016-02-27 NOTE — Telephone Encounter (Signed)
Phentermine is very reasonably priced. Patient will need to purchase if she wishes on continue med. Insurance will not pay for it.

## 2016-02-28 NOTE — Telephone Encounter (Signed)
Left message return call 02/28/16 

## 2016-02-28 NOTE — Telephone Encounter (Signed)
Discussed with pt

## 2016-04-30 ENCOUNTER — Other Ambulatory Visit: Payer: Self-pay | Admitting: Family Medicine

## 2016-06-13 ENCOUNTER — Other Ambulatory Visit: Payer: Self-pay | Admitting: Nurse Practitioner

## 2016-06-16 NOTE — Telephone Encounter (Signed)
No needs appt per carolynss last e mail message

## 2016-06-21 ENCOUNTER — Other Ambulatory Visit: Payer: Self-pay | Admitting: Nurse Practitioner

## 2016-06-23 NOTE — Telephone Encounter (Signed)
Needs o v first not this yr

## 2016-07-21 ENCOUNTER — Encounter: Payer: Self-pay | Admitting: Nurse Practitioner

## 2016-07-21 ENCOUNTER — Ambulatory Visit (INDEPENDENT_AMBULATORY_CARE_PROVIDER_SITE_OTHER): Payer: BLUE CROSS/BLUE SHIELD | Admitting: Nurse Practitioner

## 2016-07-21 MED ORDER — PHENTERMINE HCL 37.5 MG PO TABS: 37.5000 mg | ORAL_TABLET | Freq: Every day | ORAL | 2 refills | Status: DC

## 2016-07-21 NOTE — Progress Notes (Signed)
Subjective:  Presents for recheck on her obesity. Has been off her phentermine for about 2 months. Denies any adverse effects. Recently joined a gym. Plans to start a regular exercise program. At this time does not do any organized weight loss program.  Objective:   BP 122/80   Ht 5\' 5"  (1.651 m)   Wt 194 lb 4 oz (88.1 kg)   BMI 32.32 kg/m  NAD. Alert, oriented. Lungs clear. Heart regular rate rhythm.  Assessment:   Problem List Items Addressed This Visit      Other   Morbid obesity due to excess calories (HCC) - Primary   Relevant Medications   phentermine (ADIPEX-P) 37.5 MG tablet    '  Plan:  Meds ordered this encounter  Medications  . phentermine (ADIPEX-P) 37.5 MG tablet    Sig: Take 1 tablet (37.5 mg total) by mouth daily before breakfast.    Dispense:  30 tablet    Refill:  2    Order Specific Question:   Supervising Provider    Answer:   Riccardo DubinLUKING, WILLIAM S [[id number]]   May continue phentermine for 3 more months if weight loss continues. Strongly encourage patient began regular exercise program and look at options such as my fitness pal app. Recheck here as needed. Note she does not plan pregnancy at this time.

## 2016-08-08 ENCOUNTER — Encounter: Payer: Self-pay | Admitting: Nurse Practitioner

## 2016-11-10 ENCOUNTER — Encounter: Payer: Self-pay | Admitting: Nurse Practitioner

## 2016-11-12 ENCOUNTER — Other Ambulatory Visit: Payer: Self-pay | Admitting: Nurse Practitioner

## 2016-11-12 MED ORDER — NORETHIN-ETH ESTRAD-FE BIPHAS 1 MG-10 MCG / 10 MCG PO TABS: 1.0000 | ORAL_TABLET | Freq: Every day | ORAL | 11 refills | Status: DC

## 2016-11-20 ENCOUNTER — Other Ambulatory Visit: Payer: Self-pay | Admitting: Nurse Practitioner

## 2016-11-20 ENCOUNTER — Telehealth: Payer: Self-pay | Admitting: Nurse Practitioner

## 2016-11-20 MED ORDER — NORGESTIM-ETH ESTRAD TRIPHASIC 0.18/0.215/0.25 MG-25 MCG PO TABS: 1.0000 | ORAL_TABLET | Freq: Every day | ORAL | 11 refills | Status: DC

## 2016-11-20 NOTE — Telephone Encounter (Signed)
Called patient to [facility] and [employer]. Was unable to leave message.

## 2016-11-20 NOTE — Telephone Encounter (Signed)
Brandi Oconnell [facility].  [facility] [place]

## 2016-11-21 ENCOUNTER — Other Ambulatory Visit: Payer: Self-pay | Admitting: Nurse Practitioner

## 2016-12-22 ENCOUNTER — Encounter: Payer: Self-pay | Admitting: Nurse Practitioner

## 2016-12-24 ENCOUNTER — Other Ambulatory Visit: Payer: Self-pay | Admitting: Nurse Practitioner

## 2016-12-24 MED ORDER — NORETHIN-ETH ESTRAD-FE BIPHAS 1 MG-10 MCG / 10 MCG PO TABS: 1.0000 | ORAL_TABLET | Freq: Every day | ORAL | 11 refills | Status: DC

## 2017-01-30 ENCOUNTER — Telehealth: Payer: Self-pay | Admitting: Family Medicine

## 2017-01-30 ENCOUNTER — Other Ambulatory Visit: Payer: Self-pay | Admitting: Nurse Practitioner

## 2017-01-30 MED ORDER — NORETHIN-ETH ESTRAD-FE BIPHAS 1 MG-10 MCG / 10 MCG PO TABS: 1.0000 | ORAL_TABLET | Freq: Every day | ORAL | 3 refills | Status: DC

## 2017-01-30 NOTE — Telephone Encounter (Signed)
done

## 2017-01-30 NOTE — Telephone Encounter (Signed)
Patient said that she received a letter from her insurance company letting her know that she needs to contact her PCP and have our office prescribe her birth control for a 3 month supply.  She is hoping this can be [facility]

## 2017-02-03 ENCOUNTER — Other Ambulatory Visit: Payer: Self-pay | Admitting: Nurse Practitioner

## 2017-02-03 ENCOUNTER — Encounter: Payer: Self-pay | Admitting: Nurse Practitioner

## 2017-02-03 MED ORDER — SCOPOLAMINE 1 MG/3DAYS TD PT72: 1.0000 | MEDICATED_PATCH | TRANSDERMAL | 0 refills | Status: DC

## 2017-02-24 ENCOUNTER — Telehealth: Payer: Self-pay | Admitting: Family Medicine

## 2017-02-24 MED ORDER — NORETHIN-ETH ESTRAD-FE BIPHAS 1 MG-10 MCG / 10 MCG PO TABS: 1.0000 | ORAL_TABLET | Freq: Every day | ORAL | 1 refills | Status: DC

## 2017-02-24 NOTE — Telephone Encounter (Signed)
Patient said that she received a letter from her insurance company letting her know that she needs to contact her PCP and have our office prescribe her birth control for a 3 month supply.  She is hoping this can be done and sent to [facility].

## 2017-02-24 NOTE — Telephone Encounter (Signed)
Left message return call (medication sent into pharmacy) 02/24/2017

## 2017-02-24 NOTE — Telephone Encounter (Signed)
90 d ok, plus one ref

## 2017-03-19 DIAGNOSIS — D229 Melanocytic nevi, unspecified: Secondary | ICD-10-CM | POA: Diagnosis not present

## 2017-03-23 ENCOUNTER — Other Ambulatory Visit: Payer: Self-pay | Admitting: Nurse Practitioner

## 2017-03-24 ENCOUNTER — Encounter: Payer: Self-pay | Admitting: Nurse Practitioner

## 2017-04-06 ENCOUNTER — Ambulatory Visit: Payer: BLUE CROSS/BLUE SHIELD | Admitting: Nurse Practitioner

## 2017-04-08 ENCOUNTER — Other Ambulatory Visit: Payer: Self-pay | Admitting: Nurse Practitioner

## 2017-04-08 ENCOUNTER — Encounter: Payer: Self-pay | Admitting: Nurse Practitioner

## 2017-04-08 MED ORDER — NITROFURANTOIN MONOHYD MACRO 100 MG PO CAPS: 100.0000 mg | ORAL_CAPSULE | Freq: Two times a day (BID) | ORAL | 0 refills | Status: DC

## 2017-05-01 DIAGNOSIS — D492 Neoplasm of unspecified behavior of bone, soft tissue, and skin: Secondary | ICD-10-CM | POA: Diagnosis not present

## 2017-07-16 DIAGNOSIS — N911 Secondary amenorrhea: Secondary | ICD-10-CM | POA: Diagnosis not present

## 2017-07-27 DIAGNOSIS — N911 Secondary amenorrhea: Secondary | ICD-10-CM | POA: Diagnosis not present

## 2017-08-05 DIAGNOSIS — Z3A08 8 weeks gestation of pregnancy: Secondary | ICD-10-CM | POA: Diagnosis not present

## 2017-08-05 DIAGNOSIS — Z3480 Encounter for supervision of other normal pregnancy, unspecified trimester: Secondary | ICD-10-CM | POA: Diagnosis not present

## 2017-08-05 DIAGNOSIS — Z3401 Encounter for supervision of normal first pregnancy, first trimester: Secondary | ICD-10-CM | POA: Diagnosis not present

## 2017-08-05 DIAGNOSIS — Z3685 Encounter for antenatal screening for Streptococcus B: Secondary | ICD-10-CM | POA: Diagnosis not present

## 2017-08-05 LAB — OB RESULTS CONSOLE ANTIBODY SCREEN: ANTIBODY SCREEN: NEGATIVE

## 2017-08-05 LAB — OB RESULTS CONSOLE GC/CHLAMYDIA
Chlamydia: NEGATIVE
Gonorrhea: NEGATIVE

## 2017-08-05 LAB — OB RESULTS CONSOLE HIV ANTIBODY (ROUTINE TESTING): HIV: NONREACTIVE

## 2017-08-05 LAB — OB RESULTS CONSOLE ABO/RH: RH TYPE: POSITIVE

## 2017-08-05 LAB — OB RESULTS CONSOLE RUBELLA ANTIBODY, IGM: Rubella: IMMUNE

## 2017-08-05 LAB — OB RESULTS CONSOLE RPR: RPR: NONREACTIVE

## 2017-08-05 LAB — OB RESULTS CONSOLE HEPATITIS B SURFACE ANTIGEN: Hepatitis B Surface Ag: NEGATIVE

## 2017-08-18 DIAGNOSIS — Z3401 Encounter for supervision of normal first pregnancy, first trimester: Secondary | ICD-10-CM | POA: Diagnosis not present

## 2017-08-18 DIAGNOSIS — Z3480 Encounter for supervision of other normal pregnancy, unspecified trimester: Secondary | ICD-10-CM | POA: Diagnosis not present

## 2017-08-18 DIAGNOSIS — Z113 Encounter for screening for infections with a predominantly sexual mode of transmission: Secondary | ICD-10-CM | POA: Diagnosis not present

## 2017-09-02 DIAGNOSIS — Z3A12 12 weeks gestation of pregnancy: Secondary | ICD-10-CM | POA: Diagnosis not present

## 2017-09-02 DIAGNOSIS — Z3491 Encounter for supervision of normal pregnancy, unspecified, first trimester: Secondary | ICD-10-CM | POA: Diagnosis not present

## 2017-09-02 DIAGNOSIS — Z3682 Encounter for antenatal screening for nuchal translucency: Secondary | ICD-10-CM | POA: Diagnosis not present

## 2017-09-02 DIAGNOSIS — Z36 Encounter for antenatal screening for chromosomal anomalies: Secondary | ICD-10-CM | POA: Diagnosis not present

## 2017-09-19 ENCOUNTER — Other Ambulatory Visit: Payer: Self-pay

## 2017-09-19 ENCOUNTER — Emergency Department (HOSPITAL_COMMUNITY)
Admission: EM | Admit: 2017-09-19 | Discharge: 2017-09-20 | Disposition: A | Discharge status: Home / Self Care | Payer: BLUE CROSS/BLUE SHIELD | Attending: Emergency Medicine | Admitting: Emergency Medicine

## 2017-09-19 ENCOUNTER — Encounter (HOSPITAL_COMMUNITY): Payer: Self-pay | Admitting: Emergency Medicine

## 2017-09-19 DIAGNOSIS — Z3A15 15 weeks gestation of pregnancy: Secondary | ICD-10-CM | POA: Diagnosis not present

## 2017-09-19 DIAGNOSIS — R110 Nausea: Secondary | ICD-10-CM | POA: Diagnosis not present

## 2017-09-19 DIAGNOSIS — O26892 Other specified pregnancy related conditions, second trimester: Secondary | ICD-10-CM | POA: Diagnosis not present

## 2017-09-19 DIAGNOSIS — R112 Nausea with vomiting, unspecified: Secondary | ICD-10-CM

## 2017-09-19 DIAGNOSIS — O219 Vomiting of pregnancy, unspecified: Secondary | ICD-10-CM | POA: Diagnosis not present

## 2017-09-19 LAB — BASIC METABOLIC PANEL
ANION GAP: 15 (ref 5–15)
BUN: 11 mg/dL (ref 6–20)
CO2: 19 mmol/L — ABNORMAL LOW (ref 22–32)
Calcium: 9.7 mg/dL (ref 8.9–10.3)
Chloride: 106 mmol/L (ref 101–111)
Creatinine, Ser: 0.7 mg/dL (ref 0.44–1.00)
Glucose, Bld: 92 mg/dL (ref 65–99)
POTASSIUM: 3.3 mmol/L — AB (ref 3.5–5.1)
SODIUM: 140 mmol/L (ref 135–145)

## 2017-09-19 LAB — CBC WITH DIFFERENTIAL/PLATELET
BASOS ABS: 0 10*3/uL (ref 0.0–0.1)
BASOS PCT: 0 %
EOS ABS: 0.1 10*3/uL (ref 0.0–0.7)
Eosinophils Relative: 1 %
HCT: 41 % (ref 36.0–46.0)
Hemoglobin: 14.3 g/dL (ref 12.0–15.0)
LYMPHS ABS: 2 10*3/uL (ref 0.7–4.0)
Lymphocytes Relative: 11 %
MCH: 29.9 pg (ref 26.0–34.0)
MCHC: 34.9 g/dL (ref 30.0–36.0)
MCV: 85.6 fL (ref 78.0–100.0)
Monocytes Absolute: 1.2 10*3/uL — ABNORMAL HIGH (ref 0.1–1.0)
Monocytes Relative: 6 %
NEUTROS PCT: 82 %
Neutro Abs: 15 10*3/uL — ABNORMAL HIGH (ref 1.7–7.7)
Platelets: 302 10*3/uL (ref 150–400)
RBC: 4.79 MIL/uL (ref 3.87–5.11)
RDW: 12.2 % (ref 11.5–15.5)
WBC: 18.3 10*3/uL — AB (ref 4.0–10.5)

## 2017-09-19 MED ORDER — ONDANSETRON HCL 4 MG/2ML IJ SOLN
4.0000 mg | Freq: Once | INTRAMUSCULAR | Status: AC
  Administered 2017-09-19: 4 mg via INTRAVENOUS
  Filled 2017-09-19: qty 2

## 2017-09-19 MED ORDER — ONDANSETRON HCL 4 MG PO TABS: 4.0000 mg | ORAL_TABLET | Freq: Three times a day (TID) | ORAL | 0 refills | Status: DC | PRN

## 2017-09-19 MED ORDER — SODIUM CHLORIDE 0.9 % IV BOLUS (SEPSIS)
1000.0000 mL | Freq: Once | INTRAVENOUS | Status: AC
  Administered 2017-09-19: 1000 mL via INTRAVENOUS

## 2017-09-19 NOTE — ED Provider Notes (Signed)
[facility] EMERGENCY DEPARTMENT Provider Note   CSN: [id number] Arrival date & time: 09/19/17  2028     History   Chief Complaint Chief Complaint  Patient presents with  . Emesis During Pregnancy    HPI Brandi Oconnell is a 27 y.o. female.   Emesis   This is a new problem. The current episode started yesterday. The problem occurs 5 to 10 times per day. The problem has not changed since onset.The emesis has an appearance of stomach contents. There has been no fever. Associated symptoms include chills. Pertinent negatives include no arthralgias, no diarrhea, no headaches, no myalgias and no sweats. Risk factors include ill contacts.    Past Medical History:  Diagnosis Date  . Allergy   . Frequent UTI   . GERD (gastroesophageal reflux disease)   . IBS (irritable bowel syndrome)   . Migraine headache     Patient Active Problem List   Diagnosis Date Noted  . Morbid obesity due to excess calories (HCC) 11/14/2015  . Esophageal reflux 08/27/2013  . Chronic idiopathic constipation 07/15/2013  . Contraception management 04/27/2013  . Unspecified gastritis and gastroduodenitis without mention of hemorrhage 12/02/2012    Past Surgical History:  Procedure Laterality Date  . WISDOM TOOTH EXTRACTION  11/2012    OB History    Gravida Para Term Preterm AB Living   1             SAB TAB Ectopic Multiple Live Births                   Home Medications    Prior to Admission medications   Medication Sig Start Date End Date Taking? Authorizing Provider  nitrofurantoin, macrocrystal-monohydrate, (MACROBID) 100 MG capsule Take 1 capsule (100 mg total) by mouth 2 (two) times daily. 04/08/17   Campbell Riches, NP  Norethindrone-Ethinyl Estradiol-Fe Biphas (LO LOESTRIN FE) 1 MG-10 MCG / 10 MCG tablet Take 1 tablet by mouth daily. 02/24/17   Merlyn Albert, MD  ondansetron (ZOFRAN) 4 MG tablet Take 1 tablet (4 mg total) by mouth every 8 (eight) hours as needed for nausea or  vomiting. 09/19/17   Jonty Morrical, Barbara Cower, MD  pantoprazole (PROTONIX) 40 MG tablet take 1 tablet by mouth once daily 03/23/17   Campbell Riches, NP  phentermine (ADIPEX-P) 37.5 MG tablet Take 1 tablet (37.5 mg total) by mouth daily before breakfast. 07/21/16   Campbell Riches, NP  scopolamine (TRANSDERM-SCOP) 1 MG/3DAYS Place 1 patch (1.5 mg total) onto the skin every 3 (three) days. 02/03/17   Campbell Riches, NP    Family History Family History  Problem Relation Age of Onset  . Cancer Mother        melonoma  . Cancer Maternal Grandmother        melonoma  . Heart disease Maternal Grandfather   . Hypertension Paternal Grandfather   . Hyperlipidemia Paternal Grandfather     Social History Social History   Tobacco Use  . Smoking status: Never Smoker  . Smokeless tobacco: Never Used  Substance Use Topics  . Alcohol use: No    Frequency: Never  . Drug use: No     Allergies   Hydrocodone; Oxycodone; and Penicillins   Review of Systems Review of Systems  Constitutional: Positive for chills.  Gastrointestinal: Positive for vomiting. Negative for diarrhea.  Musculoskeletal: Negative for arthralgias and myalgias.  Neurological: Negative for headaches.  All other systems reviewed and are negative.    Physical Exam  Updated Vital Signs BP 109/67   Pulse 78   Temp (!) 97.5 F (36.4 C) (Oral)   Resp 16   Ht 5\' 5"  (1.651 m)   Wt 88 kg (194 lb)   LMP 05/28/2017   SpO2 100%   BMI 32.28 kg/m   Physical Exam  Constitutional: She is oriented to person, place, and time. She appears well-developed and well-nourished.  HENT:  Head: Normocephalic and atraumatic.  Eyes: Conjunctivae and EOM are normal.  Neck: Normal range of motion.  Cardiovascular: Normal rate and regular rhythm.  No murmur heard. Pulmonary/Chest: Effort normal and breath sounds normal. No stridor. No respiratory distress.  Abdominal: Soft. She exhibits no distension.  Musculoskeletal: Normal range of  motion. She exhibits no edema or deformity.  Neurological: She is alert and oriented to person, place, and time. No cranial nerve deficit. Coordination normal.  Skin: Skin is warm and dry.  Nursing note and vitals reviewed.    ED Treatments / Results  Labs (all labs ordered are listed, but only abnormal results are displayed) Labs Reviewed  CBC WITH DIFFERENTIAL/PLATELET - Abnormal; Notable for the following components:      Result Value   WBC 18.3 (*)    Neutro Abs 15.0 (*)    Monocytes Absolute 1.2 (*)    All other components within normal limits  BASIC METABOLIC PANEL - Abnormal; Notable for the following components:   Potassium 3.3 (*)    CO2 19 (*)    All other components within normal limits  URINALYSIS, ROUTINE W REFLEX MICROSCOPIC    EKG  EKG Interpretation None       Radiology No results found.  Procedures Procedures (including critical care time)  Medications Ordered in ED Medications  sodium chloride 0.9 % bolus 1,000 mL (0 mLs Intravenous Stopped 09/19/17 2346)  ondansetron (ZOFRAN) injection 4 mg (4 mg Intravenous Given 09/19/17 2226)     Initial Impression / Assessment and Plan / ED Course  I have reviewed the triage vital signs and the nursing notes.  Pertinent labs & imaging results that were available during my care of the patient were reviewed by me and considered in my medical decision making (see chart for details).     Suspect viral related vomiting. Sick contact with same. abdomen benign. No vaginal bleeding or leaking of fluid.  Suspect gastritis/atypical gastroenteritis Will hydrate pending labs/zofran and po challenge.   Wbc high but unlikely bacterial infection without pain, fever etc. Tolerating PO. Nausea improved. Abdomen benign. Stable for dc w/ pcp follow up.  Final Clinical Impressions(s) / ED Diagnoses   Final diagnoses:  Nausea and vomiting, intractability of vomiting not specified, unspecified vomiting type    ED  Discharge Orders        Ordered    ondansetron (ZOFRAN) 4 MG tablet  Every 8 hours PRN     09/19/17 2344       Micca Matura, Barbara CowerJason, MD 09/20/17 [id number]

## 2017-09-19 NOTE — ED Triage Notes (Addendum)
Patient complaining of vomiting since last night. States she is [redacted] weeks pregnant. Denies pain.

## 2017-09-20 NOTE — ED Notes (Signed)
Pt ambulatory to waiting room. Pt verbalized understanding of discharge instructions.   

## 2017-10-15 DIAGNOSIS — Z363 Encounter for antenatal screening for malformations: Secondary | ICD-10-CM | POA: Diagnosis not present

## 2017-10-15 DIAGNOSIS — Z3A18 18 weeks gestation of pregnancy: Secondary | ICD-10-CM | POA: Diagnosis not present

## 2017-12-09 DIAGNOSIS — Z3480 Encounter for supervision of other normal pregnancy, unspecified trimester: Secondary | ICD-10-CM | POA: Diagnosis not present

## 2017-12-09 DIAGNOSIS — Z23 Encounter for immunization: Secondary | ICD-10-CM | POA: Diagnosis not present

## 2017-12-11 ENCOUNTER — Other Ambulatory Visit: Payer: Self-pay | Admitting: *Deleted

## 2017-12-11 MED ORDER — PANTOPRAZOLE SODIUM 40 MG PO TBEC: 40.0000 mg | DELAYED_RELEASE_TABLET | Freq: Every day | ORAL | 1 refills | Status: DC

## 2017-12-16 ENCOUNTER — Telehealth: Payer: Self-pay | Admitting: Family Medicine

## 2017-12-16 NOTE — Telephone Encounter (Signed)
Last seen 05/11/17

## 2017-12-16 NOTE — Telephone Encounter (Signed)
Patient is requesting her Rx for pantoprazole (PROTONIX) 40 MG tablet written for a 90 day supply due to insurance.  [facility] [address]

## 2017-12-18 ENCOUNTER — Other Ambulatory Visit: Payer: Self-pay | Admitting: *Deleted

## 2017-12-18 DIAGNOSIS — O99810 Abnormal glucose complicating pregnancy: Secondary | ICD-10-CM | POA: Diagnosis not present

## 2017-12-18 DIAGNOSIS — D72829 Elevated white blood cell count, unspecified: Secondary | ICD-10-CM | POA: Diagnosis not present

## 2017-12-18 DIAGNOSIS — Z3A27 27 weeks gestation of pregnancy: Secondary | ICD-10-CM | POA: Diagnosis not present

## 2017-12-19 MED ORDER — PANTOPRAZOLE SODIUM 40 MG PO TBEC: 40.0000 mg | DELAYED_RELEASE_TABLET | Freq: Every day | ORAL | 1 refills | Status: DC

## 2017-12-20 NOTE — Telephone Encounter (Signed)
90 d ok, needs o v before further

## 2017-12-21 MED ORDER — PANTOPRAZOLE SODIUM 40 MG PO TBEC: 40.0000 mg | DELAYED_RELEASE_TABLET | Freq: Every day | ORAL | 0 refills | Status: DC

## 2017-12-21 NOTE — Telephone Encounter (Signed)
Ordered

## 2017-12-31 DIAGNOSIS — Z3480 Encounter for supervision of other normal pregnancy, unspecified trimester: Secondary | ICD-10-CM | POA: Diagnosis not present

## 2017-12-31 DIAGNOSIS — Z130 Encounter for screening for diseases of the blood and blood-forming organs and certain disorders involving the immune mechanism: Secondary | ICD-10-CM | POA: Diagnosis not present

## 2017-12-31 DIAGNOSIS — Z3A29 29 weeks gestation of pregnancy: Secondary | ICD-10-CM | POA: Diagnosis not present

## 2018-01-05 ENCOUNTER — Telehealth: Payer: Self-pay | Admitting: Hematology

## 2018-01-05 ENCOUNTER — Encounter: Payer: Self-pay | Admitting: Hematology

## 2018-01-05 NOTE — Telephone Encounter (Signed)
New referral received from Dr. Henderson Cloudomblin for dx of leukocytosis in [place][regnancy. Pthas been scheduled to see Dr. Candise CheKale on 7/24 at 1pm. Pt aware to arrive 30 minutes early. Letter mailed.

## 2018-01-05 NOTE — Telephone Encounter (Signed)
Pt called to move appointment to 7/29 at 1pm.

## 2018-01-27 ENCOUNTER — Encounter: Payer: BLUE CROSS/BLUE SHIELD | Admitting: Hematology

## 2018-01-29 NOTE — Progress Notes (Signed)
HEMATOLOGY/ONCOLOGY CONSULTATION NOTE  Date of Service: 02/01/2018  Patient Care Team: Merlyn AlbertLuking, William S, MD as PCP - General (Family Medicine)  CHIEF COMPLAINTS/PURPOSE OF CONSULTATION:  Leukocytosis in pregnancy   HISTORY OF PRESENTING ILLNESS:   Brandi Oconnell is a wonderful 27 y.o. female who has been referred to us by Dr. Harold HedgeJames Tomblin for evaluation and management of Leukocytosis in pregnancy.. The pt reports that she is doing well overall and notes no fevers/chills/nigtsweat.  No rashes. No dysuria, respiratory symptoms, diarrhea or other symptoms of infection.   No weight loss.  The pt was noted to have leucocytosis with a WBC count of 18.3k on 3/16/02019with ANC of [id number], monocytes of 1.2k.  Most recent lab results (12/31/17) of CBC is as follows: all values are WNL except for WBC at 18.4k. 12/19/17 CBC w/diff revealed WBC at 17.7k, Neutrophils abs at 13.3k, Monocytes abs at 1.1k, Eosinophils abs at 500.   On review of systems, pt reports HTN pr proteinuria ro any other suggestion of preclampsia. No use of steroids.   Rpt labs today showed improvement in her WBC to 15.8 with ANC 11.5k with monocyte count of 1.4k  MEDICAL HISTORY:  Past Medical History:  Diagnosis Date   Allergy    Frequent UTI    GERD (gastroesophageal reflux disease)    IBS (irritable bowel syndrome)    Migraine headache     SURGICAL HISTORY: Past Surgical History:  Procedure Laterality Date   WISDOM TOOTH EXTRACTION  11/2012    SOCIAL HISTORY: Social History   Socioeconomic History   Marital status: Married    Spouse name: Not on file   Number of children: Not on file   Years of education: Not on file   Highest education level: Not on file  Occupational History   Not on file  Social Needs   Financial resource strain: Not on file   Food insecurity:    Worry: Not on file    Inability: Not on file   Transportation needs:    Medical: Not on file    Non-medical:  Not on file  Tobacco Use   Smoking status: Never Smoker   Smokeless tobacco: Never Used  Substance and Sexual Activity   Alcohol use: No    Frequency: Never   Drug use: No   Sexual activity: Yes    Birth control/protection: Pill  Lifestyle   Physical activity:    Days per week: Not on file    Minutes per session: Not on file   Stress: Not on file  Relationships   Social connections:    Talks on phone: Not on file    Gets together: Not on file    Attends religious service: Not on file    Active member of club or organization: Not on file    Attends meetings of clubs or organizations: Not on file    Relationship status: Not on file   Intimate partner violence:    Fear of current or ex partner: Not on file    Emotionally abused: Not on file    Physically abused: Not on file    Forced sexual activity: Not on file  Other Topics Concern   Not on file  Social History Narrative   Not on file    FAMILY HISTORY: Family History  Problem Relation Age of Onset   Cancer Mother        melonoma   Cancer Maternal Grandmother        melonoma  Heart disease Maternal Grandfather    Hypertension Paternal Grandfather    Hyperlipidemia Paternal Grandfather     ALLERGIES:  is allergic to hydrocodone; oxycodone; and penicillins.  MEDICATIONS:  Current Outpatient Medications  Medication Sig Dispense Refill   ondansetron (ZOFRAN) 4 MG tablet Take 1 tablet (4 mg total) by mouth every 8 (eight) hours as needed for nausea or vomiting. 12 tablet 0   pantoprazole (PROTONIX) 40 MG tablet Take 1 tablet (40 mg total) by mouth daily. 30 tablet 1   No current facility-administered medications for this visit.     REVIEW OF SYSTEMS:    10 Point review of Systems was done is negative except as noted above.  PHYSICAL EXAMINATION: ECOG PERFORMANCE STATUS: 0 - Asymptomatic  . Vitals:   02/01/18 1338  BP: 115/87  Pulse: 92  Resp: 18  Temp: 98.5 F (36.9 C)  SpO2: 100%     Filed Weights   02/01/18 1338  Weight: 223 lb 4.8 oz (101.3 kg)   .Body mass index is 37.16 kg/m.  GENERAL:alert, in no acute distress and comfortable SKIN: no acute rashes, no significant lesions EYES: conjunctiva are pink and non-injected, sclera anicteric OROPHARYNX: MMM, no exudates, no oropharyngeal erythema or ulceration NECK: supple, no JVD LYMPH:  no palpable lymphadenopathy in the cervical, axillary or inguinal regions LUNGS: clear to auscultation b/l with normal respiratory effort HEART: regular rate & rhythm ABDOMEN:  normoactive bowel sounds , non tender, not distended. Extremity: no pedal edema PSYCH: alert & oriented x 3 with fluent speech NEURO: no focal motor/sensory deficits  LABORATORY DATA:  I have reviewed the data as listed  . CBC Latest Ref Rng & Units 02/01/2018 09/19/2017 12/09/2012  WBC 3.9 - 10.3 K/uL 15.8(H) 18.3(H) 8.2  Hemoglobin 11.6 - 15.9 g/dL 16.1 09.6 04.5  Hematocrit 34.8 - 46.6 % 40.0 41.0 43.1  Platelets 145 - 400 K/uL 237 302 340    CMP Latest Ref Rng & Units 02/01/2018 09/19/2017 12/17/2015  Glucose 70 - 99 mg/dL 98 92 94  BUN 6 - 20 mg/dL 7 11 9   Creatinine 0.44 - 1.00 mg/dL 4.09 8.11 9.14(N)  Sodium 135 - 145 mmol/L 137 140 138  Potassium 3.5 - 5.1 mmol/L 3.6 3.3(L) 4.0  Chloride 98 - 111 mmol/L 105 106 99  CO2 22 - 32 mmol/L 25 19(L) 21  Calcium 8.9 - 10.3 mg/dL 9.1 9.7 9.6  Total Protein 6.5 - 8.1 g/dL 6.8 - 7.3  Total Bilirubin 0.3 - 1.2 mg/dL 0.5 - 0.6  Alkaline Phos 38 - 126 U/L 181(H) - 73  AST 15 - 41 U/L 18 - 19  ALT 0 - 44 U/L 14 - 37(H)   12/19/17 CBC w/diff:   Component     Latest Ref Rng & Units 02/01/2018  Color, Urine     YELLOW YELLOW  Appearance     CLEAR CLEAR  Specific Gravity, Urine     1.005 - 1.030 1.023  pH     5.0 - 8.0 6.0  Glucose     NEGATIVE mg/dL NEGATIVE  Hgb urine dipstick     NEGATIVE NEGATIVE  Bilirubin Urine     NEGATIVE NEGATIVE  Ketones, ur     NEGATIVE mg/dL NEGATIVE  Protein      NEGATIVE mg/dL NEGATIVE  Nitrite     NEGATIVE NEGATIVE  Leukocytes, UA     NEGATIVE NEGATIVE  RBC / HPF     0 - 5 RBC/hpf 0-5  WBC, UA  0 - 5 WBC/hpf 0-5  Bacteria, UA     NONE SEEN RARE (A)  Squamous Epithelial / LPF     0 - 5 0-5  Mucus      PRESENT  Specimen Description      URINE, CLEAN CATCH . . .  Special Requests      NONE . Marland Kitchen .  Culture      <10,000 COLONIES/mL INSIGNIFICANT GROWTH (A) . . .  Report Status      02/02/2018 FINAL  Haptoglobin     34 - 200 mg/dL 161  Sed Rate     0 - 22 mm/hr 12  LDH     98 - 192 U/L 113    RADIOGRAPHIC STUDIES: I have personally reviewed the radiological images as listed and agreed with the findings in the report. No results found.  ASSESSMENT & PLAN:   27 y.o. female with   1. Leukocytosis in pregnancy - primarily neutrophilia with borderline monocytosis. No specific focal symptoms. No symptoms of infection. UA/Ucx unrevealing Appears to be stable and has improved on labs today  No LNadenopathy no hepatosplenomegaly No evidence of pre-eclampsia  PLAN -leucocytosis likely reactive and related to pregnancy. No evidnece of infection or lymphoproliferative disease  -no other focal symptoms suggestive of any alternative etiology for the neutrophilia. -smear reviewed , no increased blasts -UA, UCx - no evidence of UTI -no associated PLT or RBC abnormalities suggesting a primary BM disorder at this time.  Labs today RTC with Dr Candise Che in 4 months with labs  All of the patients questions were answered with apparent satisfaction. The patient knows to call the clinic with any problems, questions or concerns.  The total time spent in the appt was 35 minutes and more than 50% was on counseling and direct patient cares.    Wyvonnia Lora MD MS AAHIVMS [facility] Hematology/Oncology Physician [facility]  (Office):       [phone] (Work cell):  [phone] (Fax):           [phone]  02/01/2018 2:02  PM  I, Marcelline Mates, am acting as a Neurosurgeon for Dr Candise Che.   .I have reviewed the above documentation for accuracy and completeness, and I agree with the above. Johney Maine MD

## 2018-02-01 ENCOUNTER — Other Ambulatory Visit: Payer: Self-pay | Admitting: *Deleted

## 2018-02-01 ENCOUNTER — Inpatient Hospital Stay: Payer: BLUE CROSS/BLUE SHIELD | Attending: Hematology | Admitting: Hematology

## 2018-02-01 ENCOUNTER — Inpatient Hospital Stay: Payer: BLUE CROSS/BLUE SHIELD

## 2018-02-01 ENCOUNTER — Telehealth: Payer: Self-pay | Admitting: Hematology

## 2018-02-01 VITALS — BP 115/87 | HR 92 | Temp 98.5°F | Resp 18 | Ht 65.0 in | Wt 223.3 lb

## 2018-02-01 DIAGNOSIS — O99119 Other diseases of the blood and blood-forming organs and certain disorders involving the immune mechanism complicating pregnancy, unspecified trimester: Secondary | ICD-10-CM | POA: Insufficient documentation

## 2018-02-01 DIAGNOSIS — D72829 Elevated white blood cell count, unspecified: Secondary | ICD-10-CM

## 2018-02-01 DIAGNOSIS — K5904 Chronic idiopathic constipation: Secondary | ICD-10-CM

## 2018-02-01 LAB — URINALYSIS, COMPLETE (UACMP) WITH MICROSCOPIC
Bilirubin Urine: NEGATIVE
Glucose, UA: NEGATIVE mg/dL
Hgb urine dipstick: NEGATIVE
Ketones, ur: NEGATIVE mg/dL
LEUKOCYTES UA: NEGATIVE
Nitrite: NEGATIVE
Protein, ur: NEGATIVE mg/dL
SPECIFIC GRAVITY, URINE: 1.023 (ref 1.005–1.030)
pH: 6 (ref 5.0–8.0)

## 2018-02-01 LAB — CMP (CANCER CENTER ONLY)
ALT: 14 U/L (ref 0–44)
AST: 18 U/L (ref 15–41)
Albumin: 3.2 g/dL — ABNORMAL LOW (ref 3.5–5.0)
Alkaline Phosphatase: 181 U/L — ABNORMAL HIGH (ref 38–126)
Anion gap: 7 (ref 5–15)
BUN: 7 mg/dL (ref 6–20)
CHLORIDE: 105 mmol/L (ref 98–111)
CO2: 25 mmol/L (ref 22–32)
Calcium: 9.1 mg/dL (ref 8.9–10.3)
Creatinine: 0.75 mg/dL (ref 0.44–1.00)
GFR, Est AFR Am: >60 mL/min (ref >60)
GFR, Estimated: >60 mL/min (ref >60)
Glucose, Bld: 98 mg/dL (ref 70–99)
Potassium: 3.6 mmol/L (ref 3.5–5.1)
Sodium: 137 mmol/L (ref 135–145)
Total Bilirubin: 0.5 mg/dL (ref 0.3–1.2)
Total Protein: 6.8 g/dL (ref 6.5–8.1)

## 2018-02-01 LAB — CBC WITH DIFFERENTIAL/PLATELET
Basophils Absolute: 0 10*3/uL (ref 0.0–0.1)
Basophils Relative: 0 %
EOS PCT: 2 %
Eosinophils Absolute: 0.3 10*3/uL (ref 0.0–0.5)
HCT: 40 % (ref 34.8–46.6)
Hemoglobin: 13.8 g/dL (ref 11.6–15.9)
LYMPHS ABS: 2.6 10*3/uL (ref 0.9–3.3)
LYMPHS PCT: 16 %
MCH: 30.5 pg (ref 25.1–34.0)
MCHC: 34.5 g/dL (ref 31.5–36.0)
MCV: 88.3 fL (ref 79.5–101.0)
MONO ABS: 1.4 10*3/uL — AB (ref 0.1–0.9)
MONOS PCT: 9 %
Neutro Abs: 11.5 10*3/uL — ABNORMAL HIGH (ref 1.5–6.5)
Neutrophils Relative %: 73 %
PLATELETS: 237 10*3/uL (ref 145–400)
RBC: 4.53 MIL/uL (ref 3.70–5.45)
RDW: 12.6 % (ref 11.2–14.5)
WBC: 15.8 10*3/uL — ABNORMAL HIGH (ref 3.9–10.3)

## 2018-02-01 LAB — SAVE SMEAR

## 2018-02-01 LAB — SEDIMENTATION RATE: Sed Rate: 12 mm/hr (ref 0–22)

## 2018-02-01 LAB — LACTATE DEHYDROGENASE: LDH: 113 U/L (ref 98–192)

## 2018-02-01 NOTE — Telephone Encounter (Signed)
Scheduled appt per 7/29 los - pt aware - no print out per patient.

## 2018-02-02 LAB — HAPTOGLOBIN: HAPTOGLOBIN: 124 mg/dL (ref 34–200)

## 2018-02-02 LAB — URINE CULTURE: Culture: <10000 — AB

## 2018-02-15 DIAGNOSIS — Z113 Encounter for screening for infections with a predominantly sexual mode of transmission: Secondary | ICD-10-CM | POA: Diagnosis not present

## 2018-02-15 DIAGNOSIS — Z3685 Encounter for antenatal screening for Streptococcus B: Secondary | ICD-10-CM | POA: Diagnosis not present

## 2018-02-15 DIAGNOSIS — Z3480 Encounter for supervision of other normal pregnancy, unspecified trimester: Secondary | ICD-10-CM | POA: Diagnosis not present

## 2018-02-15 DIAGNOSIS — Z299 Encounter for prophylactic measures, unspecified: Secondary | ICD-10-CM | POA: Diagnosis not present

## 2018-03-10 ENCOUNTER — Encounter (HOSPITAL_COMMUNITY): Payer: Self-pay | Admitting: *Deleted

## 2018-03-10 ENCOUNTER — Telehealth (HOSPITAL_COMMUNITY): Payer: Self-pay | Admitting: *Deleted

## 2018-03-10 LAB — OB RESULTS CONSOLE GBS: STREP GROUP B AG: NEGATIVE

## 2018-03-10 NOTE — Telephone Encounter (Signed)
Preadmission screen  

## 2018-03-12 ENCOUNTER — Inpatient Hospital Stay (HOSPITAL_COMMUNITY)
Admission: AD | Admit: 2018-03-12 | Disposition: A | Discharge status: Home / Self Care | Payer: BLUE CROSS/BLUE SHIELD | Source: Ambulatory Visit | Admitting: Obstetrics and Gynecology

## 2018-03-12 ENCOUNTER — Inpatient Hospital Stay (HOSPITAL_COMMUNITY): Payer: BLUE CROSS/BLUE SHIELD | Admitting: Anesthesiology

## 2018-03-12 ENCOUNTER — Inpatient Hospital Stay (HOSPITAL_COMMUNITY)
Admission: RE | Admit: 2018-03-12 | Discharge: 2018-03-15 | DRG: 788 | Disposition: A | Discharge status: Home / Self Care | Payer: BLUE CROSS/BLUE SHIELD | Attending: Obstetrics and Gynecology | Admitting: Obstetrics and Gynecology

## 2018-03-12 ENCOUNTER — Encounter (HOSPITAL_COMMUNITY): Payer: Self-pay

## 2018-03-12 DIAGNOSIS — O324XX0 Maternal care for high head at term, not applicable or unspecified: Secondary | ICD-10-CM | POA: Diagnosis not present

## 2018-03-12 DIAGNOSIS — Z3A00 Weeks of gestation of pregnancy not specified: Secondary | ICD-10-CM | POA: Diagnosis not present

## 2018-03-12 DIAGNOSIS — Z23 Encounter for immunization: Secondary | ICD-10-CM

## 2018-03-12 DIAGNOSIS — Z880 Allergy status to penicillin: Secondary | ICD-10-CM | POA: Diagnosis not present

## 2018-03-12 DIAGNOSIS — Z3A39 39 weeks gestation of pregnancy: Secondary | ICD-10-CM | POA: Diagnosis not present

## 2018-03-12 DIAGNOSIS — Z3490 Encounter for supervision of normal pregnancy, unspecified, unspecified trimester: Secondary | ICD-10-CM | POA: Diagnosis present

## 2018-03-12 DIAGNOSIS — O26813 Pregnancy related exhaustion and fatigue, third trimester: Secondary | ICD-10-CM | POA: Diagnosis present

## 2018-03-12 DIAGNOSIS — Z3483 Encounter for supervision of other normal pregnancy, third trimester: Secondary | ICD-10-CM | POA: Diagnosis not present

## 2018-03-12 LAB — CBC
HCT: 37 % (ref 36.0–46.0)
Hemoglobin: 12.8 g/dL (ref 12.0–15.0)
MCH: 29.2 pg (ref 26.0–34.0)
MCHC: 34.6 g/dL (ref 30.0–36.0)
MCV: 84.5 fL (ref 78.0–100.0)
PLATELETS: 274 10*3/uL (ref 150–400)
RBC: 4.38 MIL/uL (ref 3.87–5.11)
RDW: 12.8 % (ref 11.5–15.5)
WBC: 18.3 10*3/uL — ABNORMAL HIGH (ref 4.0–10.5)

## 2018-03-12 LAB — ABO/RH: ABO/RH(D): O POS

## 2018-03-12 LAB — TYPE AND SCREEN
ABO/RH(D): O POS
Antibody Screen: NEGATIVE

## 2018-03-12 MED ORDER — OXYTOCIN 40 UNITS IN LACTATED RINGERS INFUSION - SIMPLE MED
1.0000 m[IU]/min | INTRAVENOUS | Status: DC
  Administered 2018-03-12: 2 m[IU]/min via INTRAVENOUS

## 2018-03-12 MED ORDER — OXYTOCIN BOLUS FROM INFUSION: 500.0000 mL | Freq: Once | INTRAVENOUS | Status: DC

## 2018-03-12 MED ORDER — PHENYLEPHRINE 40 MCG/ML (10ML) SYRINGE FOR IV PUSH (FOR BLOOD PRESSURE SUPPORT)
80.0000 ug | PREFILLED_SYRINGE | INTRAVENOUS | Status: AC | PRN
  Administered 2018-03-12 – 2018-03-13 (×5): 80 ug via INTRAVENOUS
  Filled 2018-03-12 (×2): qty 10

## 2018-03-12 MED ORDER — SOD CITRATE-CITRIC ACID 500-334 MG/5ML PO SOLN
30.0000 mL | ORAL | Status: DC | PRN
  Administered 2018-03-12 – 2018-03-13 (×2): 30 mL via ORAL
  Filled 2018-03-12 (×3): qty 15

## 2018-03-12 MED ORDER — DIPHENHYDRAMINE HCL 50 MG/ML IJ SOLN: 12.5000 mg | INTRAMUSCULAR | Status: DC | PRN

## 2018-03-12 MED ORDER — LIDOCAINE HCL (PF) 1 % IJ SOLN
30.0000 mL | INTRAMUSCULAR | Status: DC | PRN
  Filled 2018-03-12: qty 30

## 2018-03-12 MED ORDER — EPHEDRINE 5 MG/ML INJ
10.0000 mg | INTRAVENOUS | Status: DC | PRN
  Filled 2018-03-12: qty 4

## 2018-03-12 MED ORDER — ACETAMINOPHEN 325 MG PO TABS: 650.0000 mg | ORAL_TABLET | ORAL | Status: DC | PRN

## 2018-03-12 MED ORDER — BUTORPHANOL TARTRATE 1 MG/ML IJ SOLN: 1.0000 mg | INTRAMUSCULAR | Status: DC | PRN

## 2018-03-12 MED ORDER — LACTATED RINGERS IV SOLN
INTRAVENOUS | Status: DC
  Administered 2018-03-12 – 2018-03-13 (×5): via INTRAVENOUS

## 2018-03-12 MED ORDER — ONDANSETRON HCL 4 MG/2ML IJ SOLN
4.0000 mg | Freq: Four times a day (QID) | INTRAMUSCULAR | Status: DC | PRN
  Administered 2018-03-13: 4 mg via INTRAVENOUS
  Filled 2018-03-12: qty 2

## 2018-03-12 MED ORDER — FENTANYL 2.5 MCG/ML BUPIVACAINE 1/10 % EPIDURAL INFUSION (WH - ANES)
14.0000 mL/h | INTRAMUSCULAR | Status: DC | PRN
  Administered 2018-03-12 – 2018-03-13 (×2): 14 mL/h via EPIDURAL
  Filled 2018-03-12 (×2): qty 100

## 2018-03-12 MED ORDER — LACTATED RINGERS IV SOLN: 500.0000 mL | Freq: Once | INTRAVENOUS | Status: DC

## 2018-03-12 MED ORDER — LACTATED RINGERS IV SOLN: 500.0000 mL | INTRAVENOUS | Status: DC | PRN

## 2018-03-12 MED ORDER — PHENYLEPHRINE 40 MCG/ML (10ML) SYRINGE FOR IV PUSH (FOR BLOOD PRESSURE SUPPORT)
80.0000 ug | PREFILLED_SYRINGE | INTRAVENOUS | Status: DC | PRN
  Administered 2018-03-12 (×2): 80 ug via INTRAVENOUS

## 2018-03-12 MED ORDER — EPHEDRINE 5 MG/ML INJ: 10.0000 mg | INTRAVENOUS | Status: DC | PRN

## 2018-03-12 MED ORDER — OXYTOCIN 40 UNITS IN LACTATED RINGERS INFUSION - SIMPLE MED
2.5000 [IU]/h | INTRAVENOUS | Status: DC
  Filled 2018-03-12: qty 1000

## 2018-03-12 MED ORDER — LIDOCAINE HCL (PF) 1 % IJ SOLN
INTRAMUSCULAR | Status: DC | PRN
  Administered 2018-03-12: 6 mL via EPIDURAL
  Administered 2018-03-12: 7 mL via EPIDURAL

## 2018-03-12 NOTE — Anesthesia Preprocedure Evaluation (Addendum)
Anesthesia Evaluation  Patient identified by MRN, date of birth, ID band Patient awake    Reviewed: Allergy & Precautions, H&P , NPO status , Patient's Chart, lab work & pertinent test results  Airway Mallampati: II  TM Distance: >3 FB Neck ROM: full    Dental no notable dental hx. (+) Teeth Intact   Pulmonary neg pulmonary ROS,    Pulmonary exam normal breath sounds clear to auscultation       Cardiovascular negative cardio ROS Normal cardiovascular exam Rhythm:regular Rate:Normal     Neuro/Psych negative psych ROS   GI/Hepatic negative GI ROS, Neg liver ROS,   Endo/Other  negative endocrine ROS  Renal/GU negative Renal ROS  negative genitourinary   Musculoskeletal negative musculoskeletal ROS (+)   Abdominal (+) + obese,   Peds  Hematology negative hematology ROS (+)   Anesthesia Other Findings   Reproductive/Obstetrics (+) Pregnancy                             Anesthesia Physical Anesthesia Plan  ASA: II  Anesthesia Plan: Epidural   Post-op Pain Management:    Induction:   PONV Risk Score and Plan:   Airway Management Planned:   Additional Equipment:   Intra-op Plan:   Post-operative Plan:   Informed Consent: I have reviewed the patients History and Physical, chart, labs and discussed the procedure including the risks, benefits and alternatives for the proposed anesthesia with the patient or authorized representative who has indicated his/her understanding and acceptance.       Plan Discussed with:   Anesthesia Plan Comments:         Anesthesia Quick Evaluation  

## 2018-03-12 NOTE — Anesthesia Pain Management Evaluation Note (Signed)
  CRNA Pain Management Visit Note  Patient: Brandi Oconnell, 27 y.o., female  "Hello I am a member of the anesthesia team at [facility]. We have an anesthesia team available at all times to provide care throughout the hospital, including epidural management and anesthesia for C-section. I don't know your plan for the delivery whether it a natural birth, water birth, IV sedation, nitrous supplementation, doula or epidural, but we want to meet your pain goals."   1.Was your pain managed to your expectations on prior hospitalizations?   No prior hospitalizations  2.What is your expectation for pain management during this hospitalization?     Epidural  3.How can we help you reach that goal? unsure  Record the patient's initial score and the patient's pain goal.   Pain: 6  Pain Goal: 7 The [facility] wants you to be able to say your pain was always managed very well.  Cephus Shelling 03/12/2018

## 2018-03-12 NOTE — Anesthesia Procedure Notes (Signed)
Epidural Patient location during procedure: OB Start time: 03/12/2018 6:25 PM End time: 03/12/2018 6:28 PM  Staffing Anesthesiologist: Leilani Able, MD Performed: anesthesiologist   Preanesthetic Checklist Completed: patient identified, site marked, surgical consent, pre-op evaluation, timeout performed, IV checked, risks and benefits discussed and monitors and equipment checked  Epidural Patient position: sitting Prep: site prepped and draped and DuraPrep Patient monitoring: continuous pulse ox and blood pressure Approach: midline Location: L3-L4 Injection technique: LOR air  Needle:  Needle type: Tuohy  Needle gauge: 17 G Needle length: 9 cm and 9 Needle insertion depth: 6 cm Catheter type: closed end flexible Catheter size: 19 Gauge Catheter at skin depth: 11 cm Test dose: negative and Other  Assessment Sensory level: T9 Events: blood not aspirated, injection not painful, no injection resistance, negative IV test and no paresthesia  Additional Notes Reason for block:procedure for pain

## 2018-03-12 NOTE — H&P (Signed)
Brandi Oconnell is a 27 y.o. female presenting for IOL by patient request.  Pregnancy uncomplicated and GBS-. OB History    Gravida  1   Para      Term      Preterm      AB      Living        SAB      TAB      Ectopic      Multiple      Live Births             Past Medical History:  Diagnosis Date  . Allergy   . Frequent UTI   . GERD (gastroesophageal reflux disease)   . IBS (irritable bowel syndrome)   . Migraine headache    Past Surgical History:  Procedure Laterality Date  . WISDOM TOOTH EXTRACTION  11/2012   Family History: family history includes Cancer in her maternal grandmother and mother; Diabetes in her paternal grandfather; Heart disease in her maternal grandfather; Hyperlipidemia in her paternal grandfather; Hypertension in her maternal grandfather and paternal grandfather; Rheum arthritis in her maternal grandmother; Thyroid disease in her maternal grandmother. Social History:  reports that she has never smoked. She has never used smokeless tobacco. She reports that she does not drink alcohol or use drugs.     Maternal Diabetes: No Genetic Screening: Normal Maternal Ultrasounds/Referrals: Normal Fetal Ultrasounds or other Referrals:  None Maternal Substance Abuse:  No Significant Maternal Medications:  None Significant Maternal Lab Results:  None Other Comments:  None  ROS History Dilation: (P) 3 Effacement (%): (P) 80 Station: -2 Exam by:: (P) Dr. Rana Snare Blood pressure 124/75, pulse 94, temperature 98.8 F (37.1 Oconnell), temperature source Oral, resp. rate 18, height 5\' 5"  (1.651 m), weight 104.3 kg, last menstrual period 05/28/2017. Exam Physical Exam  Prenatal labs: ABO, Rh: O/Positive/-- (01/30 0000) Antibody: Negative (01/30 0000) Rubella: Immune (01/30 0000) RPR: Nonreactive (01/30 0000)  HBsAg: Negative (01/30 0000)  HIV: Non-reactive (01/30 0000)  GBS: Negative (09/04 0000)   Assessment/Plan: IUP at term Favorable cx, AROM and  pitocin IOL Anticipate SVD   Brandi Oconnell 03/12/2018, 3:43 PM

## 2018-03-13 ENCOUNTER — Encounter (HOSPITAL_COMMUNITY)
Admission: RE | Disposition: A | Discharge status: Home / Self Care | Payer: Self-pay | Source: Home / Self Care | Attending: Obstetrics and Gynecology

## 2018-03-13 ENCOUNTER — Other Ambulatory Visit: Payer: Self-pay

## 2018-03-13 ENCOUNTER — Encounter (HOSPITAL_COMMUNITY): Payer: Self-pay

## 2018-03-13 LAB — RPR: RPR Ser Ql: NONREACTIVE

## 2018-03-13 SURGERY — Surgical Case
Anesthesia: Epidural | Site: Abdomen | Wound class: Clean Contaminated

## 2018-03-13 MED ORDER — PRENATAL MULTIVITAMIN CH
1.0000 | ORAL_TABLET | Freq: Every day | ORAL | Status: DC
  Administered 2018-03-14 – 2018-03-15 (×2): 1 via ORAL
  Filled 2018-03-13 (×2): qty 1

## 2018-03-13 MED ORDER — OXYCODONE HCL 5 MG PO TABS: 10.0000 mg | ORAL_TABLET | ORAL | Status: DC | PRN

## 2018-03-13 MED ORDER — NALBUPHINE HCL 10 MG/ML IJ SOLN: 5.0000 mg | Freq: Once | INTRAMUSCULAR | Status: DC | PRN

## 2018-03-13 MED ORDER — SODIUM CHLORIDE 0.9 % IJ SOLN
INTRAMUSCULAR | Status: AC
  Filled 2018-03-13: qty 20

## 2018-03-13 MED ORDER — MEPERIDINE HCL 25 MG/ML IJ SOLN
INTRAMUSCULAR | Status: AC
  Filled 2018-03-13: qty 1

## 2018-03-13 MED ORDER — GENTAMICIN SULFATE 40 MG/ML IJ SOLN
INTRAVENOUS | Status: AC
  Administered 2018-03-13: 520 mg via INTRAVENOUS
  Filled 2018-03-13: qty 13

## 2018-03-13 MED ORDER — DEXAMETHASONE SODIUM PHOSPHATE 4 MG/ML IJ SOLN
INTRAMUSCULAR | Status: DC | PRN
  Administered 2018-03-13: 4 mg via INTRAVENOUS

## 2018-03-13 MED ORDER — SIMETHICONE 80 MG PO CHEW: 80.0000 mg | CHEWABLE_TABLET | ORAL | Status: DC | PRN

## 2018-03-13 MED ORDER — LACTATED RINGERS IV SOLN
INTRAVENOUS | Status: DC
  Administered 2018-03-13: 999 mL via INTRAVENOUS

## 2018-03-13 MED ORDER — IBUPROFEN 600 MG PO TABS
600.0000 mg | ORAL_TABLET | Freq: Four times a day (QID) | ORAL | Status: DC
  Administered 2018-03-13 – 2018-03-15 (×8): 600 mg via ORAL
  Filled 2018-03-13 (×8): qty 1

## 2018-03-13 MED ORDER — DIPHENHYDRAMINE HCL 25 MG PO CAPS
25.0000 mg | ORAL_CAPSULE | ORAL | Status: DC | PRN
  Filled 2018-03-13: qty 1

## 2018-03-13 MED ORDER — OXYTOCIN 40 UNITS IN LACTATED RINGERS INFUSION - SIMPLE MED: 2.5000 [IU]/h | INTRAVENOUS | Status: AC

## 2018-03-13 MED ORDER — DIPHENHYDRAMINE HCL 50 MG/ML IJ SOLN
INTRAMUSCULAR | Status: DC | PRN
  Administered 2018-03-13: 25 mg via INTRAVENOUS

## 2018-03-13 MED ORDER — SENNOSIDES-DOCUSATE SODIUM 8.6-50 MG PO TABS
2.0000 | ORAL_TABLET | ORAL | Status: DC
  Administered 2018-03-13 – 2018-03-14 (×2): 2 via ORAL
  Filled 2018-03-13 (×2): qty 2

## 2018-03-13 MED ORDER — KETOROLAC TROMETHAMINE 30 MG/ML IJ SOLN
30.0000 mg | Freq: Once | INTRAMUSCULAR | Status: AC | PRN
  Administered 2018-03-13: 30 mg via INTRAVENOUS

## 2018-03-13 MED ORDER — OXYTOCIN 10 UNIT/ML IJ SOLN
INTRAVENOUS | Status: DC | PRN
  Administered 2018-03-13: 40 [IU] via INTRAVENOUS

## 2018-03-13 MED ORDER — SCOPOLAMINE 1 MG/3DAYS TD PT72: 1.0000 | MEDICATED_PATCH | Freq: Once | TRANSDERMAL | Status: DC

## 2018-03-13 MED ORDER — ZOLPIDEM TARTRATE 5 MG PO TABS: 5.0000 mg | ORAL_TABLET | Freq: Every evening | ORAL | Status: DC | PRN

## 2018-03-13 MED ORDER — NALOXONE HCL 4 MG/10ML IJ SOLN
1.0000 ug/kg/h | INTRAVENOUS | Status: DC | PRN
  Filled 2018-03-13: qty 5

## 2018-03-13 MED ORDER — ACETAMINOPHEN 325 MG PO TABS
650.0000 mg | ORAL_TABLET | ORAL | Status: DC | PRN
  Administered 2018-03-14 – 2018-03-15 (×2): 650 mg via ORAL
  Filled 2018-03-13 (×2): qty 2

## 2018-03-13 MED ORDER — MENTHOL 3 MG MT LOZG: 1.0000 | LOZENGE | OROMUCOSAL | Status: DC | PRN

## 2018-03-13 MED ORDER — NALBUPHINE HCL 10 MG/ML IJ SOLN: 5.0000 mg | INTRAMUSCULAR | Status: DC | PRN

## 2018-03-13 MED ORDER — COCONUT OIL OIL: 1.0000 "application " | TOPICAL_OIL | Status: DC | PRN

## 2018-03-13 MED ORDER — MORPHINE SULFATE (PF) 0.5 MG/ML IJ SOLN
INTRAMUSCULAR | Status: DC | PRN
  Administered 2018-03-13: 3 mg via EPIDURAL

## 2018-03-13 MED ORDER — MEPERIDINE HCL 25 MG/ML IJ SOLN
6.2500 mg | INTRAMUSCULAR | Status: DC | PRN
  Administered 2018-03-13: 6.25 mg via INTRAVENOUS

## 2018-03-13 MED ORDER — KETOROLAC TROMETHAMINE 30 MG/ML IJ SOLN
INTRAMUSCULAR | Status: AC
  Filled 2018-03-13: qty 1

## 2018-03-13 MED ORDER — SODIUM CHLORIDE 0.9% FLUSH: 3.0000 mL | INTRAVENOUS | Status: DC | PRN

## 2018-03-13 MED ORDER — OXYCODONE HCL 5 MG PO TABS
5.0000 mg | ORAL_TABLET | ORAL | Status: DC | PRN
  Administered 2018-03-14: 5 mg via ORAL
  Filled 2018-03-13: qty 1

## 2018-03-13 MED ORDER — SCOPOLAMINE 1 MG/3DAYS TD PT72
MEDICATED_PATCH | TRANSDERMAL | Status: DC | PRN
  Administered 2018-03-13: 1 via TRANSDERMAL

## 2018-03-13 MED ORDER — HYDROMORPHONE HCL 1 MG/ML IJ SOLN
0.2500 mg | INTRAMUSCULAR | Status: DC | PRN
  Administered 2018-03-13: 0.5 mg via INTRAVENOUS

## 2018-03-13 MED ORDER — LACTATED RINGERS IV SOLN
INTRAVENOUS | Status: DC | PRN
  Administered 2018-03-13: 09:00:00 via INTRAVENOUS

## 2018-03-13 MED ORDER — TETANUS-DIPHTH-ACELL PERTUSSIS 5-2.5-18.5 LF-MCG/0.5 IM SUSP: 0.5000 mL | Freq: Once | INTRAMUSCULAR | Status: DC

## 2018-03-13 MED ORDER — ONDANSETRON HCL 4 MG/2ML IJ SOLN
4.0000 mg | Freq: Three times a day (TID) | INTRAMUSCULAR | Status: DC | PRN
  Administered 2018-03-13: 4 mg via INTRAVENOUS
  Filled 2018-03-13: qty 2

## 2018-03-13 MED ORDER — OXYTOCIN 10 UNIT/ML IJ SOLN
INTRAMUSCULAR | Status: AC
  Filled 2018-03-13: qty 4

## 2018-03-13 MED ORDER — LIDOCAINE-EPINEPHRINE (PF) 2 %-1:200000 IJ SOLN
INTRAMUSCULAR | Status: AC
  Filled 2018-03-13: qty 20

## 2018-03-13 MED ORDER — MEPERIDINE HCL 25 MG/ML IJ SOLN
INTRAMUSCULAR | Status: DC | PRN
  Administered 2018-03-13: 12.5 mg via INTRAVENOUS

## 2018-03-13 MED ORDER — HYDROMORPHONE HCL 1 MG/ML IJ SOLN
INTRAMUSCULAR | Status: AC
  Filled 2018-03-13: qty 0.5

## 2018-03-13 MED ORDER — MORPHINE SULFATE (PF) 0.5 MG/ML IJ SOLN
INTRAMUSCULAR | Status: AC
  Filled 2018-03-13: qty 10

## 2018-03-13 MED ORDER — DIPHENHYDRAMINE HCL 25 MG PO CAPS: 25.0000 mg | ORAL_CAPSULE | Freq: Four times a day (QID) | ORAL | Status: DC | PRN

## 2018-03-13 MED ORDER — METOCLOPRAMIDE HCL 5 MG/ML IJ SOLN
INTRAMUSCULAR | Status: DC | PRN
  Administered 2018-03-13: 5 mg via INTRAVENOUS

## 2018-03-13 MED ORDER — SIMETHICONE 80 MG PO CHEW
80.0000 mg | CHEWABLE_TABLET | Freq: Three times a day (TID) | ORAL | Status: DC
  Administered 2018-03-13 – 2018-03-15 (×7): 80 mg via ORAL
  Filled 2018-03-13 (×7): qty 1

## 2018-03-13 MED ORDER — SCOPOLAMINE 1 MG/3DAYS TD PT72
MEDICATED_PATCH | TRANSDERMAL | Status: AC
  Filled 2018-03-13: qty 1

## 2018-03-13 MED ORDER — INFLUENZA VAC SPLIT QUAD 0.5 ML IM SUSY
0.5000 mL | PREFILLED_SYRINGE | INTRAMUSCULAR | Status: AC
  Administered 2018-03-14: 0.5 mL via INTRAMUSCULAR

## 2018-03-13 MED ORDER — ONDANSETRON HCL 4 MG/2ML IJ SOLN
INTRAMUSCULAR | Status: DC | PRN
  Administered 2018-03-13: 4 mg via INTRAVENOUS

## 2018-03-13 MED ORDER — WITCH HAZEL-GLYCERIN EX PADS: 1.0000 "application " | MEDICATED_PAD | CUTANEOUS | Status: DC | PRN

## 2018-03-13 MED ORDER — PROMETHAZINE HCL 25 MG/ML IJ SOLN: 6.2500 mg | INTRAMUSCULAR | Status: DC | PRN

## 2018-03-13 MED ORDER — MEASLES, MUMPS & RUBELLA VAC ~~LOC~~ INJ
0.5000 mL | INJECTION | Freq: Once | SUBCUTANEOUS | Status: DC
  Filled 2018-03-13: qty 0.5

## 2018-03-13 MED ORDER — DIPHENHYDRAMINE HCL 50 MG/ML IJ SOLN: 12.5000 mg | INTRAMUSCULAR | Status: DC | PRN

## 2018-03-13 MED ORDER — ONDANSETRON HCL 4 MG/2ML IJ SOLN
INTRAMUSCULAR | Status: AC
  Filled 2018-03-13: qty 2

## 2018-03-13 MED ORDER — SIMETHICONE 80 MG PO CHEW
80.0000 mg | CHEWABLE_TABLET | ORAL | Status: DC
  Administered 2018-03-13 – 2018-03-14 (×2): 80 mg via ORAL
  Filled 2018-03-13 (×2): qty 1

## 2018-03-13 MED ORDER — DEXAMETHASONE SODIUM PHOSPHATE 4 MG/ML IJ SOLN
INTRAMUSCULAR | Status: AC
  Filled 2018-03-13: qty 1

## 2018-03-13 MED ORDER — NALOXONE HCL 0.4 MG/ML IJ SOLN: 0.4000 mg | INTRAMUSCULAR | Status: DC | PRN

## 2018-03-13 MED ORDER — PANTOPRAZOLE SODIUM 40 MG PO TBEC
40.0000 mg | DELAYED_RELEASE_TABLET | Freq: Every day | ORAL | Status: DC
  Administered 2018-03-13 – 2018-03-15 (×3): 40 mg via ORAL
  Filled 2018-03-13 (×3): qty 1

## 2018-03-13 MED ORDER — PROMETHAZINE HCL 25 MG/ML IJ SOLN
12.5000 mg | Freq: Four times a day (QID) | INTRAMUSCULAR | Status: DC | PRN
  Administered 2018-03-13: 12.5 mg via INTRAVENOUS
  Filled 2018-03-13: qty 1

## 2018-03-13 MED ORDER — LIDOCAINE-EPINEPHRINE (PF) 2 %-1:200000 IJ SOLN
INTRAMUSCULAR | Status: DC | PRN
  Administered 2018-03-13 (×2): 5 mL via INTRADERMAL

## 2018-03-13 MED ORDER — DIBUCAINE 1 % RE OINT: 1.0000 "application " | TOPICAL_OINTMENT | RECTAL | Status: DC | PRN

## 2018-03-13 SURGICAL SUPPLY — 38 items
ADH SKN CLS APL DERMABOND .7 (GAUZE/BANDAGES/DRESSINGS) ×1
CHLORAPREP W/TINT 26ML (MISCELLANEOUS) ×3 IMPLANT
CLAMP CORD UMBIL (MISCELLANEOUS) IMPLANT
CLOTH BEACON ORANGE TIMEOUT ST (SAFETY) ×3 IMPLANT
DERMABOND ADVANCED (GAUZE/BANDAGES/DRESSINGS) ×2
DERMABOND ADVANCED .7 DNX12 (GAUZE/BANDAGES/DRESSINGS) IMPLANT
DRSG OPSITE POSTOP 4X10 (GAUZE/BANDAGES/DRESSINGS) ×3 IMPLANT
ELECT REM PT RETURN 9FT ADLT (ELECTROSURGICAL) ×3
ELECTRODE REM PT RTRN 9FT ADLT (ELECTROSURGICAL) ×1 IMPLANT
EXTRACTOR VACUUM M CUP 4 TUBE (SUCTIONS) IMPLANT
EXTRACTOR VACUUM M CUP 4' TUBE (SUCTIONS)
FORMULA NB 0-3 ENFAMIL (FORMULA) ×2 IMPLANT
GLOVE BIOGEL PI IND STRL 7.0 (GLOVE) ×1 IMPLANT
GLOVE BIOGEL PI INDICATOR 7.0 (GLOVE) ×2
GLOVE SURG ORTHO 8.0 STRL STRW (GLOVE) ×3 IMPLANT
GOWN STRL REUS W/TWL LRG LVL3 (GOWN DISPOSABLE) ×6 IMPLANT
KIT ABG SYR 3ML LUER SLIP (SYRINGE) ×3 IMPLANT
NDL BLUNT 18X1 FOR OR ONLY (NEEDLE) IMPLANT
NDL HYPO 25X5/8 SAFETYGLIDE (NEEDLE) ×1 IMPLANT
NDL SAFETY ECLIPSE 18X1.5 (NEEDLE) IMPLANT
NEEDLE BLUNT 18X1 FOR OR ONLY (NEEDLE) ×3 IMPLANT
NEEDLE HYPO 18GX1.5 SHARP (NEEDLE) ×3
NEEDLE HYPO 25X5/8 SAFETYGLIDE (NEEDLE) ×3 IMPLANT
NS IRRIG 1000ML POUR BTL (IV SOLUTION) ×3 IMPLANT
PACK C SECTION WH (CUSTOM PROCEDURE TRAY) ×3 IMPLANT
PAD OB MATERNITY 4.3X12.25 (PERSONAL CARE ITEMS) ×3 IMPLANT
PENCIL SMOKE EVAC W/HOLSTER (ELECTROSURGICAL) ×3 IMPLANT
RETRACTOR WND ALEXIS 25 LRG (MISCELLANEOUS) IMPLANT
RTRCTR WOUND ALEXIS 25CM LRG (MISCELLANEOUS) ×3
SUT MNCRL 0 VIOLET CTX 36 (SUTURE) ×3 IMPLANT
SUT MON AB 4-0 PS1 27 (SUTURE) ×3 IMPLANT
SUT MONOCRYL 0 CTX 36 (SUTURE) ×8
SUT PDS AB 1 CT  36 (SUTURE) ×2
SUT PDS AB 1 CT 36 (SUTURE) IMPLANT
SUT VIC AB 1 CTX 36 (SUTURE)
SUT VIC AB 1 CTX36XBRD ANBCTRL (SUTURE) IMPLANT
TOWEL OR 17X24 6PK STRL BLUE (TOWEL DISPOSABLE) ×3 IMPLANT
TRAY FOLEY W/BAG SLVR 14FR LF (SET/KITS/TRAYS/PACK) ×3 IMPLANT

## 2018-03-13 NOTE — Op Note (Signed)
Cesarean Section Procedure Note  Pre-operative Diagnosis: IUP at term, Arrest of descent     Post-operative Diagnosis: same  Surgeon: Turner Daniels   Assistants: none  Anesthesia: epidural  Procedure:  Low Segment Transverse cesarean section  Procedure Details  The patient was seen in the Holding Room. The risks, benefits, complications, treatment options, and expected outcomes were discussed with the patient.  The patient concurred with the proposed plan, giving informed consent.  The site of surgery properly noted/marked.. A Time Out was held and the above information confirmed.  After induction of anesthesia, the patient was draped and prepped in the usual sterile manner. A Pfannenstiel incision was made and carried down through the subcutaneous tissue to the fascia. Fascial incision was made and extended transversely. The fascia was separated from the underlying rectus tissue superiorly and inferiorly. The peritoneum was identified and entered. Peritoneal incision was extended longitudinally. The utero-vesical peritoneal reflection was incised transversely and the bladder flap was bluntly freed from the lower uterine segment. A low transverse uterine incision was made. Delivered from vertex presentation was a baby with Apgar scores of 9 at one minute and 9 at five minutes. After the umbilical cord was clamped and cut cord blood was obtained for evaluation. The placenta was removed intact and appeared normal. The uterine outline, tubes and ovaries appeared normal. The uterine incision was closed with running locked sutures of 0 monocryl and imbricated with 0 monocryl. Hemostasis was observed. Lavage was carried out until clear. Due to blood noted in the foley noted by anesthesia, we retrograde filled the bladder and no leaking of milk noted.  There was a small bleeding vessel on the peritoneal edge that was cauterized, otherwise the bladder looked intact and hemostatic. The peritoneum was then  closed with 0 monocryl and rectus muscles plicated in the midline.  After hemostasis was assured, the fascia was then reapproximated with running sutures of 0 PDS. Irrigation was applied and after adequate hemostasis was assured, the skin was reapproximated with subcutaneous sutures using 4-0 monocryl.  Instrument, sponge, and needle counts were correct prior the abdominal closure and at the conclusion of the case. The patient received 2 grams cefotetan preoperatively.  Findings: Viable female  Estimated Blood Loss:  675cc         Specimens: Placenta was sent to labor and delivery         Complications:  None

## 2018-03-13 NOTE — Anesthesia Postprocedure Evaluation (Signed)
Anesthesia Post Note  Patient: Brandi Oconnell  Procedure(s) Performed: CESAREAN SECTION with bladder irrigation (N/A Abdomen)     Patient location during evaluation: Mother Baby Anesthesia Type: Epidural Level of consciousness: awake and alert Pain management: pain level controlled Vital Signs Assessment: post-procedure vital signs reviewed and stable Respiratory status: spontaneous breathing, nonlabored ventilation and respiratory function stable Cardiovascular status: stable Postop Assessment: no headache, no backache and epidural receding Anesthetic complications: no    Last Vitals:  Vitals:   03/13/18 1045 03/13/18 1050  BP: 120/67   Pulse: 66 84  Resp: 16 (!) 21  Temp: 36.9 C   SpO2: 99% 100%    Last Pain:  Vitals:   03/13/18 1045  TempSrc: Axillary  PainSc: 0-No pain   Pain Goal:                 Lowella Curb

## 2018-03-13 NOTE — Anesthesia Postprocedure Evaluation (Signed)
Anesthesia Post Note  Patient: Brandi Oconnell  Procedure(s) Performed: CESAREAN SECTION with bladder irrigation (N/A Abdomen)     Patient location during evaluation: Mother Baby Anesthesia Type: Epidural Level of consciousness: awake and alert, oriented and patient cooperative Pain management: pain level controlled Vital Signs Assessment: post-procedure vital signs reviewed and stable Respiratory status: spontaneous breathing Cardiovascular status: stable Postop Assessment: no headache, epidural receding, patient able to bend at knees and no signs of nausea or vomiting Anesthetic complications: no Comments: Pain score 0.  Pt. Support person questions blood in foley catheter.  Pt encouraged to cont. To assess and notify Rana Snare MD with concerns.      Last Vitals:  Vitals:   03/13/18 1335 03/13/18 1554  BP: 114/73 116/70  Pulse: 97 77  Resp: 18 18  Temp: 36.7 C 36.6 C  SpO2: 97% 96%    Last Pain:  Vitals:   03/13/18 1554  TempSrc: Oral  PainSc:    Pain Goal:                 [facility]

## 2018-03-13 NOTE — Lactation Note (Signed)
This note was copied from a baby's chart. Lactation Consultation Note  Patient Name: Brandi Oconnell Date: 03/13/2018 Reason for consult: Initial assessment;Primapara;1st time breastfeeding;Term  6 hours old FT female who is being exclusively BF by his mother, she's a P1. Baby has only had attempts so far though, RN has set mom up with a # 20 NS due to baby's difficulty to latch, mom has flat nipples. Mom has a Spectra DEBP at home; taught mom how to hand express and she was able to express a small droplet of colostrum out of her right breast. Reviewed treatment for sore nipples and showed mom and dad how to finger feed baby.  Offered assistance with latch and mom agreed to wake baby up to feed, but when LC was getting ready to take baby to the breast mom had an episode of emesis because she didn't have anything to eat, she was very nauseous. LC called her RN and she gave her some medicine. After a while when mom felt better, LC took baby to the breast STS in football position first without the NS and on posterior attempts with the NS but baby would not latch either way. Asked mom to call for latch assistance the next time baby is ready to feed. An attempt was documented. Asked mom how does she feel about pumping while in the hospital and she said she'll do anything to provide breastmilk for her baby.  Set mom up with breast shells (she brought a nursing bra to the hospital) and a DEBP, shells and pump instructions, cleaning and storage were reviewed. Mom will be pumping every 3 hours and and at least once at night.  Encouraged mom to feed baby STS 8-12 times/24 hours or sooner if feeding cues are present. Mom will finger feed baby any amount of EBM she may get through pumping. Reviewed BF brochure, BF resources and feeding diary, both parents are aware of LC services and will call PRN.  Maternal Data Formula Feeding for Exclusion: No Has patient been taught Hand Expression?: Yes Does the  patient have breastfeeding experience prior to this delivery?: No  Feeding   Interventions Interventions: Breast feeding basics reviewed;Assisted with latch;Breast massage;Hand express;Breast compression;Adjust position;Reverse pressure;DEBP;Shells;Support pillows  Lactation Tools Discussed/Used Tools: Shells;Pump Shell Type: Inverted Breast pump type: Double-Electric Breast Pump WIC Program: No Pump Review: Setup, frequency, and cleaning Initiated by:: MPeck Date initiated:: 03/13/18   Consult Status Consult Status: Follow-up Date: 03/14/18 Follow-up type: In-patient    Brandi Oconnell 03/13/2018, 3:24 PM

## 2018-03-13 NOTE — Progress Notes (Signed)
Patient ID: Brandi Oconnell, female   DOB: 1991/01/31, 27 y.o.   MRN: [id number] Pt pushing for 3.5 hours now with minimal expulsive effort due to fatigue.  Protracted labor throughout night  VSSAF FHR 140s with accesl Cat 1 Cx C/C +1 ROA  Arrest of descent rec Primary LSTCS Risks and benefits of C/S were discussed.  All questions were answered and informed consent was obtained.  Plan to proceed with low segment transverse Cesarean Section. Pt has PCN allergy and hydrocodeine but thinks she can take percocet DL

## 2018-03-13 NOTE — Consult Note (Signed)
Neonatology Note:   Attendance at C-section:    I was asked by Dr. Rana Snare to attend this primary C/S at term due to arrest of descent. The mother is a G1P0 O pos, GBS neg with an otherwise uncomplicated pregnancy. ROM 18 hours prior to delivery, fluid clear. Infant vigorous with good spontaneous cry and tone. Delayed cord clamping was done. Needed only minimal bulb suctioning. Ap 9/9. Lungs clear to ausc in DR. Infant is able to remain with his mother for skin to skin time under nursing supervision. Transferred to the care of Pediatrician.   Doretha Sou, MD

## 2018-03-13 NOTE — Transfer of Care (Signed)
Immediate Anesthesia Transfer of Care Note  Patient: Brandi Oconnell  Procedure(s) Performed: CESAREAN SECTION with bladder irrigation (N/A Abdomen)  Patient Location: PACU  Anesthesia Type:Epidural  Level of Consciousness: awake, alert  and oriented  Airway & Oxygen Therapy: Patient Spontanous Breathing  Post-op Assessment: Report given to RN and Post -op Vital signs reviewed and stable  Post vital signs: Reviewed and stable  Last Vitals:  Vitals Value Taken Time  BP    Temp    Pulse    Resp    SpO2      Last Pain:  Vitals:   03/13/18 0802  TempSrc: Oral  PainSc: 0-No pain         Complications: No apparent anesthesia complications

## 2018-03-13 NOTE — Addendum Note (Signed)
Addendum  created 03/13/18 1809 by Angela Adam, CRNA   Sign clinical note

## 2018-03-14 ENCOUNTER — Encounter (HOSPITAL_COMMUNITY): Payer: Self-pay | Admitting: Obstetrics and Gynecology

## 2018-03-14 LAB — CBC
HEMATOCRIT: 31.3 % — AB (ref 36.0–46.0)
HEMOGLOBIN: 10.6 g/dL — AB (ref 12.0–15.0)
MCH: 29.3 pg (ref 26.0–34.0)
MCHC: 33.9 g/dL (ref 30.0–36.0)
MCV: 86.5 fL (ref 78.0–100.0)
Platelets: 227 10*3/uL (ref 150–400)
RBC: 3.62 MIL/uL — AB (ref 3.87–5.11)
RDW: 12.7 % (ref 11.5–15.5)
WBC: 25.1 10*3/uL — ABNORMAL HIGH (ref 4.0–10.5)

## 2018-03-14 NOTE — Plan of Care (Signed)
Patient progressing as expected.

## 2018-03-14 NOTE — Progress Notes (Signed)
Subjective: Postpartum Day 1: Cesarean Delivery Patient reports tolerating PO, + flatus and no problems voiding.    Objective: Vital signs in last 24 hours: Temp:  [97.7 F (36.5 C)-98.5 F (36.9 C)] 97.7 F (36.5 C) (09/08 0900) Pulse Rate:  [58-102] 62 (09/08 0900) Resp:  [14-23] 18 (09/08 0900) BP: (91-129)/(55-83) 100/62 (09/08 0900) SpO2:  [95 %-100 %] 97 % (09/08 0333)  Physical Exam:  General: alert, cooperative, appears stated age and no distress Lochia: appropriate Uterine Fundus: firm Incision: healing well DVT Evaluation: No evidence of DVT seen on physical exam.  Recent Labs    03/12/18 1412 03/14/18 0623  HGB 12.8 10.6*  HCT 37.0 31.3*    Assessment/Plan: Status post Cesarean section. Doing well postoperatively.  Patient reports hx of elevated WBC previously evaluated by Hematology No clinical sxs of infection .  Will repeat in am Patient unsure if wants circ by [place] or [place] peds.  She will let us know if she wants it done.Candice Camp C 03/14/2018, 9:35 AM

## 2018-03-15 ENCOUNTER — Ambulatory Visit: Payer: Self-pay

## 2018-03-15 LAB — CBC
HEMATOCRIT: 30.9 % — AB (ref 36.0–46.0)
HEMOGLOBIN: 10.5 g/dL — AB (ref 12.0–15.0)
MCH: 29.3 pg (ref 26.0–34.0)
MCHC: 34 g/dL (ref 30.0–36.0)
MCV: 86.3 fL (ref 78.0–100.0)
Platelets: 267 10*3/uL (ref 150–400)
RBC: 3.58 MIL/uL — AB (ref 3.87–5.11)
RDW: 13 % (ref 11.5–15.5)
WBC: 17.8 10*3/uL — AB (ref 4.0–10.5)

## 2018-03-15 MED ORDER — IBUPROFEN 600 MG PO TABS: 600.0000 mg | ORAL_TABLET | Freq: Four times a day (QID) | ORAL | 0 refills | Status: DC

## 2018-03-15 MED ORDER — OXYCODONE-ACETAMINOPHEN 5-325 MG PO TABS: 1.0000 | ORAL_TABLET | ORAL | 0 refills | Status: DC | PRN

## 2018-03-15 NOTE — Lactation Note (Signed)
This note was copied from a baby's chart. Lactation Consultation Note  Patient Name: Brandi Oconnell Brandi Oconnell [id number] Date: 03/15/2018 Reason for consult: Follow-up assessment;Mother's request;Term P1, 60 hour infant, billi ights, weight loss -6%, Mom has c/s delivery. LC reviewed hand expression and mom is feeling more confident. LC assisted mom in doing a nipple roll to elongate nipple due to having flat nipples and NS was placed on breast. LC prefilled NS will formula and infant latched with wide gape and audible swallows heard  on mom's left breast , infant continues to suckle and LC also saw mom's EBM in NS after refilling NS for volume.Latch score -8. Dad will assist mom in prefilling NS with formula, infant needs 20 ml based on age 25 hours. Infant is latching and staying on breast without falling asleep and active swallowing is being heard by parents and LC. Mom plans: Mom will  pump after BF infant at breast  with  NS and give EBM in NS at next feeding and subtracting from formula intake. Mom will continue to work on latching infant to breast.  [facility]  Out patient appointment at [facility] after discharge. Referral sent by [facility]. Mom will continue to BF according hunger cues, 8 to 12 times within 24 hours including nights. Mom will pump after each feeding or ever 3 hours within 24 hours.  Maternal Data    Feeding Feeding Type: Breast Fed(BF 7 mins as LC left room )  LATCH Score Latch: Grasps breast easily, tongue down, lips flanged, rhythmical sucking.  Audible Swallowing: Spontaneous and intermittent  Type of Nipple: Flat  Comfort (Breast/Nipple): Soft / non-tender  Hold (Positioning): Assistance needed to correctly position infant at breast and maintain latch.  LATCH Score: 8  Interventions Interventions: Assisted with latch;Support pillows;Position options  Lactation Tools Discussed/Used Nipple shield size: 20   Consult Status Consult Status:  Follow-up Date: 03/16/18 Follow-up type: In-patient    Brandi Oconnell 03/15/2018, 9:04 PM

## 2018-03-15 NOTE — Discharge Summary (Signed)
Obstetric Discharge Summary Reason for Admission: induction of labor Prenatal Procedures: none Intrapartum Procedures: cesarean: low cervical, transverse Postpartum Procedures: none Complications-Operative and Postpartum: none Hemoglobin  Date Value Ref Range Status  03/15/2018 10.5 (L) 12.0 - 15.0 g/dL Final   HCT  Date Value Ref Range Status  03/15/2018 30.9 (L) 36.0 - 46.0 % Final    Physical Exam:  General: alert Lochia: appropriate Uterine Fundus: firm Incision: healing well DVT Evaluation: No evidence of DVT seen on physical exam.  Discharge Diagnoses: Term Pregnancy-delivered  Discharge Information: Date: 03/15/2018 Activity: pelvic rest Diet: routine Medications: PNV, Ibuprofen and Percocet Condition: stable Instructions: refer to practice specific booklet Discharge to: home Follow-up Information    [place] Schedule an appointment as soon as possible for a visit in 1 week(s).   Contact information: [address] [place] [address] [phone]           Newborn Data: Live born female  Birth Weight: 8 lb 9.6 oz (3900 g) APGAR: 9, 9  Newborn Delivery   Birth date/time:  03/13/2018 08:52:00 Delivery type:  C-Section, Low Transverse C-section categorization:  Primary     Home with mother.  Brandi Oconnell 03/15/2018, 7:21 AM

## 2018-03-17 DIAGNOSIS — N390 Urinary tract infection, site not specified: Secondary | ICD-10-CM | POA: Diagnosis not present

## 2018-03-17 DIAGNOSIS — R300 Dysuria: Secondary | ICD-10-CM | POA: Diagnosis not present

## 2018-04-06 ENCOUNTER — Ambulatory Visit: Payer: BLUE CROSS/BLUE SHIELD | Admitting: Family Medicine

## 2018-04-06 ENCOUNTER — Encounter: Payer: Self-pay | Admitting: Family Medicine

## 2018-04-06 VITALS — BP 98/78 | Temp 98.5°F | Ht 65.0 in | Wt 208.0 lb

## 2018-04-06 DIAGNOSIS — K5904 Chronic idiopathic constipation: Secondary | ICD-10-CM

## 2018-04-06 NOTE — Patient Instructions (Addendum)
Colace every day 200 mg  miralax generic one scoop twice per d ti stools start softening then oncr per day

## 2018-04-06 NOTE — Progress Notes (Signed)
   Subjective:    Patient ID: Brandi Oconnell, female    DOB: 03-30-1991, 27 y.o.   MRN: [id number]  HPI Patient is here today with complaints of ibs three weeks after having her son.She states she has had Ibs with Constipation.    Was put on Amitiza in the past, but it was making the pt migraines worse. While pregnant no problems,but as soon as she had him the constipation has returned    Tendency towards chronic consipation''  dxed in the past with ibs constip predomiant   pos pain with bm's  abd pain and abd camps not a huge issue  amitiza in past helped the constip but flared up the migraines   Breast feeding  Taking notheing for bowels   wondred abput meds for it   Still having sub constip at this tie s   .  Review of Systems No headache, no major weight loss or weight gain, no chest pain no back pain abdominal pain no change in bowel habits complete ROS otherwise negative     Objective:   Physical Exam  Alert vitals stable, NAD. Blood pressure good on repeat. HEENT normal. Lungs clear. Heart regular rate and rhythm.       Assessment & Plan:  Impression chronic constipation predominant IBS.  Discussed.  Patient has not really given over-the-counter agents of [place].  Recommend twice daily MiraLAX until stools off and then daily.  Also add Colace every single day.  If this does not work give this a phone call back

## 2018-04-22 DIAGNOSIS — R319 Hematuria, unspecified: Secondary | ICD-10-CM | POA: Diagnosis not present

## 2018-04-22 DIAGNOSIS — Z1389 Encounter for screening for other disorder: Secondary | ICD-10-CM | POA: Diagnosis not present

## 2018-04-22 DIAGNOSIS — N390 Urinary tract infection, site not specified: Secondary | ICD-10-CM | POA: Diagnosis not present

## 2018-04-29 DIAGNOSIS — Z3202 Encounter for pregnancy test, result negative: Secondary | ICD-10-CM | POA: Diagnosis not present

## 2018-04-29 DIAGNOSIS — Z30017 Encounter for initial prescription of implantable subdermal contraceptive: Secondary | ICD-10-CM | POA: Diagnosis not present

## 2018-05-31 ENCOUNTER — Ambulatory Visit: Payer: BLUE CROSS/BLUE SHIELD | Admitting: Hematology

## 2018-05-31 ENCOUNTER — Other Ambulatory Visit: Payer: BLUE CROSS/BLUE SHIELD

## 2018-06-09 DIAGNOSIS — M545 Low back pain: Secondary | ICD-10-CM | POA: Diagnosis not present

## 2018-06-09 DIAGNOSIS — M533 Sacrococcygeal disorders, not elsewhere classified: Secondary | ICD-10-CM | POA: Diagnosis not present

## 2018-06-14 ENCOUNTER — Ambulatory Visit (HOSPITAL_COMMUNITY)
Admission: RE | Admit: 2018-06-14 | Discharge: 2018-06-14 | Disposition: A | Discharge status: Home / Self Care | Payer: BLUE CROSS/BLUE SHIELD | Source: Ambulatory Visit | Attending: Obstetrics and Gynecology | Admitting: Obstetrics and Gynecology

## 2018-06-14 ENCOUNTER — Other Ambulatory Visit (HOSPITAL_COMMUNITY): Payer: Self-pay | Admitting: Obstetrics and Gynecology

## 2018-06-14 DIAGNOSIS — M533 Sacrococcygeal disorders, not elsewhere classified: Secondary | ICD-10-CM | POA: Diagnosis not present

## 2018-06-17 DIAGNOSIS — L659 Nonscarring hair loss, unspecified: Secondary | ICD-10-CM | POA: Diagnosis not present

## 2018-06-17 DIAGNOSIS — M5440 Lumbago with sciatica, unspecified side: Secondary | ICD-10-CM | POA: Diagnosis not present

## 2018-06-22 ENCOUNTER — Telehealth: Payer: Self-pay | Admitting: Family Medicine

## 2018-06-22 ENCOUNTER — Other Ambulatory Visit: Payer: Self-pay

## 2018-06-22 MED ORDER — PANTOPRAZOLE SODIUM 40 MG PO TBEC: 40.0000 mg | DELAYED_RELEASE_TABLET | Freq: Every day | ORAL | 3 refills | Status: DC

## 2018-06-22 NOTE — Telephone Encounter (Signed)
Pt is requesting refill 90 day supply on her acid reflux medication. It has started back up after having her child. Please send to [id number] - [place], [place] - [address] [facility] [address]

## 2018-06-22 NOTE — Telephone Encounter (Signed)
protonix 40 numb 90 one q am three ref

## 2018-06-22 NOTE — Telephone Encounter (Signed)
Patient returned call and was informed that medication was sent in. Pt verbalized understanding.

## 2018-06-22 NOTE — Telephone Encounter (Signed)
Please advise 

## 2018-06-22 NOTE — Telephone Encounter (Signed)
Medication sent in. Left message to return call 

## 2018-06-24 ENCOUNTER — Encounter: Payer: Self-pay | Admitting: Family Medicine

## 2018-06-24 ENCOUNTER — Ambulatory Visit (INDEPENDENT_AMBULATORY_CARE_PROVIDER_SITE_OTHER): Payer: BLUE CROSS/BLUE SHIELD | Admitting: Family Medicine

## 2018-06-24 VITALS — BP 102/82 | Ht 65.0 in | Wt 216.0 lb

## 2018-06-24 DIAGNOSIS — M5441 Lumbago with sciatica, right side: Secondary | ICD-10-CM

## 2018-06-24 MED ORDER — DICLOFENAC SODIUM 75 MG PO TBEC: 75.0000 mg | DELAYED_RELEASE_TABLET | Freq: Two times a day (BID) | ORAL | 0 refills | Status: DC

## 2018-06-24 NOTE — Patient Instructions (Signed)

## 2018-06-24 NOTE — Progress Notes (Signed)
   Subjective:    Patient ID: Brandi Oconnell, female    DOB: 07/25/1990, 27 y.o.   MRN: [id number]  HPI  Patient is here today with right side sciatica pain. She states this had intially started when she was pregnant,but it was manageable. Now getting worse. Has seen Obgyn regarding this on Dec 12 ,2019 she has arranged for the pt to have some pelvic floor therapy Dec 26 ,2019.Due back to work Dec 30 ,2019, wants to get some short term disability.  Reports hx of sciatica during pregnancy, started having tail bone pain about 1.5 months after delivery which was in September was seen by OB on 12/12, xray was normal. Now having pain to right lower back/buttocks that radiates down to her right knee, x 2 weeks. Reports sitting makes it worse, standing helps relieve pain. Pain is pretty constant but becomes more painful with sitting and certain movements. Reports pain is described as an ache. Reports when pain radiates also rare occurence feels tingly/numbness at times to right thigh. Denies weakness. No known injury. No previous hx of same prior to pregnancy. Feels like symptoms are getting worse.   Not currently breastfeeding.  Have tried ibuprofen, has tried different pillows (wedge pillow) without much relief.    Review of Systems  Constitutional: Negative for fever and unexpected weight change.  Gastrointestinal:       Negative for incontinence of stool  Genitourinary:       Negative for urinary incontinence, negative for saddle paresthesias  Musculoskeletal: Positive for back pain. Negative for gait problem, neck pain and neck stiffness.       Objective:   Physical Exam Vitals signs and nursing note reviewed.  Constitutional:      General: She is not in acute distress.    Appearance: Normal appearance.  HENT:     Head: Normocephalic and atraumatic.  Cardiovascular:     Rate and Rhythm: Normal rate and regular rhythm.     Heart sounds: Normal heart sounds.  Pulmonary:     Effort:  Pulmonary effort is normal. No respiratory distress.     Breath sounds: Normal breath sounds.  Musculoskeletal:     Comments: Back: No deformity noted. No tenderness over spinous processes or over spinal accessory muscles. Negative SLR bilaterally. Bilateral lower extremities with DTRs intact and symmetrical, strength and sensation intact. No pain with ROM.   Neurological:     Mental Status: She is alert.           Assessment & Plan:  Acute right-sided low back pain with right-sided sciatica  Exam is benign, no red flags noted. Recommend treatment with anti-inflammatory medication x 2 weeks with gentle stretching exercises. F/u in 2 weeks or sooner if needed. If no improvement at that time, may need imaging. Too soon to determine need for short-term disability. Recommend pt trial anti-inflammatories first and see how she does with this. Pt verbalized understanding.   Dr. Lubertha SouthSteve Luking was consulted on this case and is in agreement with the above treatment plan.

## 2018-07-01 DIAGNOSIS — R278 Other lack of coordination: Secondary | ICD-10-CM | POA: Diagnosis not present

## 2018-07-01 DIAGNOSIS — M6281 Muscle weakness (generalized): Secondary | ICD-10-CM | POA: Diagnosis not present

## 2018-07-01 DIAGNOSIS — M5418 Radiculopathy, sacral and sacrococcygeal region: Secondary | ICD-10-CM | POA: Diagnosis not present

## 2018-07-08 ENCOUNTER — Ambulatory Visit: Payer: BLUE CROSS/BLUE SHIELD | Admitting: Family Medicine

## 2018-07-08 ENCOUNTER — Encounter: Payer: Self-pay | Admitting: Family Medicine

## 2018-07-08 VITALS — BP 124/80 | Ht 65.0 in | Wt 218.8 lb

## 2018-07-08 DIAGNOSIS — M545 Low back pain, unspecified: Secondary | ICD-10-CM

## 2018-07-08 MED ORDER — DICLOFENAC SODIUM 75 MG PO TBEC: 75.0000 mg | DELAYED_RELEASE_TABLET | Freq: Two times a day (BID) | ORAL | 0 refills | Status: DC

## 2018-07-08 MED ORDER — TRAMADOL HCL 50 MG PO TABS: 50.0000 mg | ORAL_TABLET | Freq: Three times a day (TID) | ORAL | 0 refills | Status: DC | PRN

## 2018-07-08 NOTE — Progress Notes (Signed)
   Subjective:    Patient ID: Brandi Oconnell, female    DOB: 01-Aug-1990, 28 y.o.   MRN: [id number]  Back Pain  This is a new problem. Episode onset: one and a half months after child birth. The problem has been rapidly worsening since onset. The pain is present in the lumbar spine. The pain radiates to the right foot. Treatments tried: voltaren.   Pt nots ongoing pain  Now substantially worse   Maybe take s the edge off now, but minimal    PT helped a lot    Pain by far the worse radiated towards the gron  Goes all the way down into the ankkle  Pt stated back to work on Monday    Pain got wore and worse while working  Pt had to eave work due toseverity of pain the last month of preg  No close fam hx of difficulties   Pt went to phys therapy and wa told her pelvic floor was tight    Once pr wk     Review of Systems  Musculoskeletal: Positive for back pain.       Objective:   Physical Exam eam with chaperone Alert vitals stable, patient in obvious distress with movement or palpation.. Blood pressure good on repeat. HEENT normal. Lungs clear. Heart regular rate and rhythm. Negative straight leg raise.  Distinct tenderness in coccyx region.  Hip good range of motion.  No lateral hip tenderness      Assessment & Plan:  Impression persistent posterior coccyx pain.  Interestingly patient's GYN set her up with a GYN physical therapist who is working on pelvic floor exercises.  I think her pain is more focused in the coccyx and not true sciatica.  Options discussed.  Orthopedic referral.  Anti-inflammatory medicine.  Add tramadol as needed.  Side effects discussed.  Patient states absolutely unable to work with his current injury so work excuse appropriately written.    Greater than 50% of this 25 minute face to face visit was spent in counseling and discussion and coordination of care regarding the above diagnosis/diagnosies

## 2018-07-09 DIAGNOSIS — M5418 Radiculopathy, sacral and sacrococcygeal region: Secondary | ICD-10-CM | POA: Diagnosis not present

## 2018-07-09 DIAGNOSIS — R278 Other lack of coordination: Secondary | ICD-10-CM | POA: Diagnosis not present

## 2018-07-09 DIAGNOSIS — M6281 Muscle weakness (generalized): Secondary | ICD-10-CM | POA: Diagnosis not present

## 2018-07-15 DIAGNOSIS — M6281 Muscle weakness (generalized): Secondary | ICD-10-CM | POA: Diagnosis not present

## 2018-07-15 DIAGNOSIS — R278 Other lack of coordination: Secondary | ICD-10-CM | POA: Diagnosis not present

## 2018-07-15 DIAGNOSIS — M5418 Radiculopathy, sacral and sacrococcygeal region: Secondary | ICD-10-CM | POA: Diagnosis not present

## 2018-07-20 DIAGNOSIS — Z029 Encounter for administrative examinations, unspecified: Secondary | ICD-10-CM

## 2018-07-21 ENCOUNTER — Telehealth: Payer: Self-pay | Admitting: Family Medicine

## 2018-07-21 NOTE — Telephone Encounter (Signed)
Patient brought in [facility] for to be filled out. I fill in what I could .Please review highlighted areas on page 1& 2 section 3 was unsure of which to fill out if you can just check one I will fill in dates and the episode part wasn't sure of please address also.Form is in your yellow folder.

## 2018-07-21 NOTE — Telephone Encounter (Signed)
Please advise 

## 2018-07-22 DIAGNOSIS — R278 Other lack of coordination: Secondary | ICD-10-CM | POA: Diagnosis not present

## 2018-07-22 DIAGNOSIS — M5418 Radiculopathy, sacral and sacrococcygeal region: Secondary | ICD-10-CM | POA: Diagnosis not present

## 2018-07-22 DIAGNOSIS — M6281 Muscle weakness (generalized): Secondary | ICD-10-CM | POA: Diagnosis not present

## 2018-07-27 DIAGNOSIS — M545 Low back pain: Secondary | ICD-10-CM | POA: Diagnosis not present

## 2018-07-27 DIAGNOSIS — M5417 Radiculopathy, lumbosacral region: Secondary | ICD-10-CM | POA: Diagnosis not present

## 2018-07-29 DIAGNOSIS — R278 Other lack of coordination: Secondary | ICD-10-CM | POA: Diagnosis not present

## 2018-07-29 DIAGNOSIS — M5418 Radiculopathy, sacral and sacrococcygeal region: Secondary | ICD-10-CM | POA: Diagnosis not present

## 2018-07-29 DIAGNOSIS — M6281 Muscle weakness (generalized): Secondary | ICD-10-CM | POA: Diagnosis not present

## 2018-07-30 ENCOUNTER — Ambulatory Visit: Payer: BLUE CROSS/BLUE SHIELD | Admitting: Family Medicine

## 2018-07-30 ENCOUNTER — Encounter: Payer: Self-pay | Admitting: Family Medicine

## 2018-07-30 VITALS — Temp 98.6°F | Ht 65.0 in | Wt 218.0 lb

## 2018-07-30 DIAGNOSIS — M545 Low back pain, unspecified: Secondary | ICD-10-CM

## 2018-07-30 NOTE — Telephone Encounter (Signed)
Form was completed today and faxed over. Copy is up front for pick up.

## 2018-07-30 NOTE — Progress Notes (Signed)
   Subjective:    Patient ID: Brandi Oconnell, female    DOB: 1990-07-20, 28 y.o.   MRN: [id number]  HPI  Patient is here today to follow up on her lower back pain.  She states the pain has gotten worse.  She says she saw an Orthopedic Dr Tuesday and they are treating as a possible back issue. They did back xrays per pt and they were good.They did give her prednisone and started it yesterday and has not noticed if it has helped thus far.  Their next plan of action is an Mri of coccyx next Thursday.   She is not taking any other medications for the pain.   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  Standing.  Unable to sit for protracted periods on the exam table.  HEENT normal lungs clear heart regular rhythm.  Low back substantial pain and tenderness to sacral and coccygeal area right hip positive pain with internal and external radiation.  Right leg plus minus straight leg raise      Assessment & Plan:  Impression severe unrelenting pain.  Mostly focused in the coccyx area.  Did see the orthopedic surgeons.  They recommended a course of steroids.  They also have augmented the GYNs physical therapy with recommendations pertaining to the low back.  They definitely recommended patient stay out of work through their follow-up in the February 7 timeframe.  Patient also to get MRI before then.    Patient to follow-up with orthopedist on February 7/work  excuse until then.  Patient has a desk job and unable to fulfill

## 2018-08-05 DIAGNOSIS — M545 Low back pain: Secondary | ICD-10-CM | POA: Diagnosis not present

## 2018-08-05 DIAGNOSIS — M5418 Radiculopathy, sacral and sacrococcygeal region: Secondary | ICD-10-CM | POA: Diagnosis not present

## 2018-08-05 DIAGNOSIS — R278 Other lack of coordination: Secondary | ICD-10-CM | POA: Diagnosis not present

## 2018-08-05 DIAGNOSIS — M6281 Muscle weakness (generalized): Secondary | ICD-10-CM | POA: Diagnosis not present

## 2018-08-10 DIAGNOSIS — M6281 Muscle weakness (generalized): Secondary | ICD-10-CM | POA: Diagnosis not present

## 2018-08-10 DIAGNOSIS — M5418 Radiculopathy, sacral and sacrococcygeal region: Secondary | ICD-10-CM | POA: Diagnosis not present

## 2018-08-10 DIAGNOSIS — R278 Other lack of coordination: Secondary | ICD-10-CM | POA: Diagnosis not present

## 2018-08-13 DIAGNOSIS — M545 Low back pain: Secondary | ICD-10-CM | POA: Diagnosis not present

## 2018-08-17 DIAGNOSIS — R278 Other lack of coordination: Secondary | ICD-10-CM | POA: Diagnosis not present

## 2018-08-17 DIAGNOSIS — M5418 Radiculopathy, sacral and sacrococcygeal region: Secondary | ICD-10-CM | POA: Diagnosis not present

## 2018-08-17 DIAGNOSIS — M6281 Muscle weakness (generalized): Secondary | ICD-10-CM | POA: Diagnosis not present

## 2018-08-25 DIAGNOSIS — R278 Other lack of coordination: Secondary | ICD-10-CM | POA: Diagnosis not present

## 2018-08-25 DIAGNOSIS — M6281 Muscle weakness (generalized): Secondary | ICD-10-CM | POA: Diagnosis not present

## 2018-08-25 DIAGNOSIS — M5418 Radiculopathy, sacral and sacrococcygeal region: Secondary | ICD-10-CM | POA: Diagnosis not present

## 2018-08-27 ENCOUNTER — Encounter: Payer: BLUE CROSS/BLUE SHIELD | Admitting: Family Medicine

## 2018-08-30 DIAGNOSIS — R278 Other lack of coordination: Secondary | ICD-10-CM | POA: Diagnosis not present

## 2018-08-30 DIAGNOSIS — M5418 Radiculopathy, sacral and sacrococcygeal region: Secondary | ICD-10-CM | POA: Diagnosis not present

## 2018-08-30 DIAGNOSIS — M533 Sacrococcygeal disorders, not elsewhere classified: Secondary | ICD-10-CM | POA: Diagnosis not present

## 2018-08-30 DIAGNOSIS — M6281 Muscle weakness (generalized): Secondary | ICD-10-CM | POA: Diagnosis not present

## 2018-09-01 ENCOUNTER — Encounter: Payer: Self-pay | Admitting: Family Medicine

## 2018-09-01 ENCOUNTER — Ambulatory Visit (INDEPENDENT_AMBULATORY_CARE_PROVIDER_SITE_OTHER): Payer: BLUE CROSS/BLUE SHIELD | Admitting: Family Medicine

## 2018-09-01 VITALS — BP 122/82 | Ht 64.5 in | Wt 220.2 lb

## 2018-09-01 DIAGNOSIS — Z13220 Encounter for screening for lipoid disorders: Secondary | ICD-10-CM

## 2018-09-01 DIAGNOSIS — Z79899 Other long term (current) drug therapy: Secondary | ICD-10-CM | POA: Diagnosis not present

## 2018-09-01 DIAGNOSIS — Z0000 Encounter for general adult medical examination without abnormal findings: Secondary | ICD-10-CM

## 2018-09-01 NOTE — Patient Instructions (Signed)
Preventive Care 18-39 Years, Female Preventive care refers to lifestyle choices and visits with your health care provider that can promote health and wellness. What does preventive care include?   A yearly physical exam. This is also called an annual well check.  Dental exams once or twice a year.  Routine eye exams. Ask your health care provider how often you should have your eyes checked.  Personal lifestyle choices, including: ? Daily care of your teeth and gums. ? Regular physical activity. ? Eating a healthy diet. ? Avoiding tobacco and drug use. ? Limiting alcohol use. ? Practicing safe sex. ? Taking vitamin and mineral supplements as recommended by your health care provider. What happens during an annual well check? The services and screenings done by your health care provider during your annual well check will depend on your age, overall health, lifestyle risk factors, and family history of disease. Counseling Your health care provider may ask you questions about your:  Alcohol use.  Tobacco use.  Drug use.  Emotional well-being.  Home and relationship well-being.  Sexual activity.  Eating habits.  Work and work environment.  Method of birth control.  Menstrual cycle.  Pregnancy history. Screening You may have the following tests or measurements:  Height, weight, and BMI.  Diabetes screening. This is done by checking your blood sugar (glucose) after you have not eaten for a while (fasting).  Blood pressure.  Lipid and cholesterol levels. These may be checked every 5 years starting at age 20.  Skin check.  Hepatitis C blood test.  Hepatitis B blood test.  Sexually transmitted disease (STD) testing.  BRCA-related cancer screening. This may be done if you have a family history of breast, ovarian, tubal, or peritoneal cancers.  Pelvic exam and Pap test. This may be done every 3 years starting at age 21. Starting at age 30, this may be done every 5  years if you have a Pap test in combination with an HPV test. Discuss your test results, treatment options, and if necessary, the need for more tests with your health care provider. Vaccines Your health care provider may recommend certain vaccines, such as:  Influenza vaccine. This is recommended every year.  Tetanus, diphtheria, and acellular pertussis (Tdap, Td) vaccine. You may need a Td booster every 10 years.  Varicella vaccine. You may need this if you have not been vaccinated.  HPV vaccine. If you are 26 or younger, you may need three doses over 6 months.  Measles, mumps, and rubella (MMR) vaccine. You may need at least one dose of MMR. You may also need a second dose.  Pneumococcal 13-valent conjugate (PCV13) vaccine. You may need this if you have certain conditions and were not previously vaccinated.  Pneumococcal polysaccharide (PPSV23) vaccine. You may need one or two doses if you smoke cigarettes or if you have certain conditions.  Meningococcal vaccine. One dose is recommended if you are age 19-21 years and a first-year college student living in a residence hall, or if you have one of several medical conditions. You may also need additional booster doses.  Hepatitis A vaccine. You may need this if you have certain conditions or if you travel or work in places where you may be exposed to hepatitis A.  Hepatitis B vaccine. You may need this if you have certain conditions or if you travel or work in places where you may be exposed to hepatitis B.  Haemophilus influenzae type b (Hib) vaccine. You may need this if you   have certain risk factors. Talk to your health care provider about which screenings and vaccines you need and how often you need them. This information is not intended to replace advice given to you by your health care provider. Make sure you discuss any questions you have with your health care provider. Document Released: 08/19/2001 Document Revised: 02/03/2017  Document Reviewed: 04/24/2015 Elsevier Interactive Patient Education  2019 [employer].

## 2018-09-01 NOTE — Progress Notes (Signed)
Subjective:    Patient ID: Brandi Oconnell, female    DOB: 02-Mar-1991, 28 y.o.   MRN: [id number]  HPI The patient comes in today for a wellness visit.  Has been out of work for tailbone pain - going back to work in March. States it is improving.   A review of their health history was completed. A review of medications was also completed.  Any needed refills;  90 day script for Pantoprazole   Eating habits: health conscious  Falls/  MVA accidents in past few months: none  Regular exercise: none  Specialist pt sees on regular basis: physical therapist   Preventative health issues were discussed.   Additional concerns: pt states she does not need a pelvic exam/pap smear or breast exam.   States did a pap smear at beginning of pregnancy (08/2017[facility] - and was normal   Was given Tdap during pregnancy  Has nexplanon - having irregular bleeding/spotting. Sexually active - 1 partner. Denies problems  Review of Systems  Constitutional: Negative for chills, fever and unexpected weight change.  HENT: Negative for congestion, ear pain and sore throat.   Eyes: Negative for discharge and visual disturbance.  Respiratory: Negative for cough and shortness of breath.   Cardiovascular: Negative for chest pain and leg swelling.  Gastrointestinal: Negative for abdominal pain and blood in stool.  Genitourinary: Negative for difficulty urinating, hematuria, menstrual problem, pelvic pain, vaginal bleeding and vaginal discharge.  Skin: Negative for color change.  Neurological: Negative for dizziness and headaches.  Psychiatric/Behavioral: Negative for dysphoric mood and suicidal ideas.       Objective:   Physical Exam Vitals signs and nursing note reviewed.  Constitutional:      General: She is not in acute distress.    Appearance: Normal appearance. She is well-developed. She is not toxic-appearing.  HENT:     Head: Normocephalic and atraumatic.     Right Ear:  Tympanic membrane normal.     Left Ear: Tympanic membrane normal.     Nose: Nose normal.     Mouth/Throat:     Mouth: Mucous membranes are moist.     Pharynx: Oropharynx is clear. Uvula midline.  Eyes:     General:        Right eye: No discharge.        Left eye: No discharge.     Extraocular Movements: Extraocular movements intact.     Conjunctiva/sclera: Conjunctivae normal.     Pupils: Pupils are equal, round, and reactive to light.  Neck:     Musculoskeletal: Neck supple.     Thyroid: No thyromegaly.  Cardiovascular:     Rate and Rhythm: Normal rate and regular rhythm.     Heart sounds: Normal heart sounds. No murmur.  Pulmonary:     Effort: Pulmonary effort is normal. No respiratory distress.     Breath sounds: Normal breath sounds. No wheezing.  Abdominal:     General: Bowel sounds are normal. There is no distension.     Palpations: Abdomen is soft. There is no mass.     Tenderness: There is no abdominal tenderness.  Genitourinary:    Comments: Defers breast and pelvic exams, denies problems.  Musculoskeletal:        General: No deformity.  Lymphadenopathy:     Cervical: No cervical adenopathy.  Skin:    General: Skin is warm and dry.  Neurological:     Mental Status: She is alert and oriented to person,  place, and time.     Coordination: Coordination normal.  Psychiatric:        Mood and Affect: Mood normal.        Behavior: Behavior normal.           Assessment & Plan:  Well adult exam - Plan: CBC with Differential, Hepatic function panel, Lipid Profile, Basic Metabolic Panel (BMET)  High risk medication use - Plan: CBC with Differential, Hepatic function panel, Lipid Profile, Basic Metabolic Panel (BMET)  Screening, lipid - Plan: CBC with Differential, Hepatic function panel, Lipid Profile, Basic Metabolic Panel (BMET)   Adult wellness-complete.wellness physical was conducted today. Importance of diet and exercise were discussed in detail.  In addition  to this a discussion regarding safety was also covered. We also reviewed over immunizations and gave recommendations regarding current immunization needed for age.  In addition to this additional areas were also touched on including: Preventative health exams needed:  Pap Smear UTD per patient.   Patient was advised yearly wellness exam. Will obtain some screening lab work, will notify of results.

## 2018-09-07 DIAGNOSIS — M5418 Radiculopathy, sacral and sacrococcygeal region: Secondary | ICD-10-CM | POA: Diagnosis not present

## 2018-09-07 DIAGNOSIS — M6281 Muscle weakness (generalized): Secondary | ICD-10-CM | POA: Diagnosis not present

## 2018-09-07 DIAGNOSIS — R278 Other lack of coordination: Secondary | ICD-10-CM | POA: Diagnosis not present

## 2018-09-07 DIAGNOSIS — M533 Sacrococcygeal disorders, not elsewhere classified: Secondary | ICD-10-CM | POA: Diagnosis not present

## 2018-09-10 DIAGNOSIS — M545 Low back pain: Secondary | ICD-10-CM | POA: Diagnosis not present

## 2018-09-10 DIAGNOSIS — M533 Sacrococcygeal disorders, not elsewhere classified: Secondary | ICD-10-CM | POA: Diagnosis not present

## 2018-09-14 DIAGNOSIS — R278 Other lack of coordination: Secondary | ICD-10-CM | POA: Diagnosis not present

## 2018-09-14 DIAGNOSIS — M6281 Muscle weakness (generalized): Secondary | ICD-10-CM | POA: Diagnosis not present

## 2018-09-14 DIAGNOSIS — M5418 Radiculopathy, sacral and sacrococcygeal region: Secondary | ICD-10-CM | POA: Diagnosis not present

## 2018-09-28 DIAGNOSIS — M6281 Muscle weakness (generalized): Secondary | ICD-10-CM | POA: Diagnosis not present

## 2018-09-28 DIAGNOSIS — M5418 Radiculopathy, sacral and sacrococcygeal region: Secondary | ICD-10-CM | POA: Diagnosis not present

## 2018-09-28 DIAGNOSIS — R278 Other lack of coordination: Secondary | ICD-10-CM | POA: Diagnosis not present

## 2018-10-07 DIAGNOSIS — M533 Sacrococcygeal disorders, not elsewhere classified: Secondary | ICD-10-CM | POA: Diagnosis not present

## 2018-10-07 DIAGNOSIS — S233XXA Sprain of ligaments of thoracic spine, initial encounter: Secondary | ICD-10-CM | POA: Diagnosis not present

## 2018-10-11 DIAGNOSIS — S233XXA Sprain of ligaments of thoracic spine, initial encounter: Secondary | ICD-10-CM | POA: Diagnosis not present

## 2018-10-11 DIAGNOSIS — M533 Sacrococcygeal disorders, not elsewhere classified: Secondary | ICD-10-CM | POA: Diagnosis not present

## 2018-10-13 DIAGNOSIS — S233XXA Sprain of ligaments of thoracic spine, initial encounter: Secondary | ICD-10-CM | POA: Diagnosis not present

## 2018-10-13 DIAGNOSIS — M533 Sacrococcygeal disorders, not elsewhere classified: Secondary | ICD-10-CM | POA: Diagnosis not present

## 2018-10-18 DIAGNOSIS — S233XXA Sprain of ligaments of thoracic spine, initial encounter: Secondary | ICD-10-CM | POA: Diagnosis not present

## 2018-10-18 DIAGNOSIS — M533 Sacrococcygeal disorders, not elsewhere classified: Secondary | ICD-10-CM | POA: Diagnosis not present

## 2018-10-21 DIAGNOSIS — M533 Sacrococcygeal disorders, not elsewhere classified: Secondary | ICD-10-CM | POA: Diagnosis not present

## 2018-10-21 DIAGNOSIS — S233XXA Sprain of ligaments of thoracic spine, initial encounter: Secondary | ICD-10-CM | POA: Diagnosis not present

## 2018-10-25 DIAGNOSIS — M533 Sacrococcygeal disorders, not elsewhere classified: Secondary | ICD-10-CM | POA: Diagnosis not present

## 2018-10-25 DIAGNOSIS — S233XXA Sprain of ligaments of thoracic spine, initial encounter: Secondary | ICD-10-CM | POA: Diagnosis not present

## 2018-10-27 DIAGNOSIS — M533 Sacrococcygeal disorders, not elsewhere classified: Secondary | ICD-10-CM | POA: Diagnosis not present

## 2018-10-27 DIAGNOSIS — S233XXA Sprain of ligaments of thoracic spine, initial encounter: Secondary | ICD-10-CM | POA: Diagnosis not present

## 2018-11-01 DIAGNOSIS — M533 Sacrococcygeal disorders, not elsewhere classified: Secondary | ICD-10-CM | POA: Diagnosis not present

## 2018-11-01 DIAGNOSIS — S233XXA Sprain of ligaments of thoracic spine, initial encounter: Secondary | ICD-10-CM | POA: Diagnosis not present

## 2018-11-03 DIAGNOSIS — S233XXA Sprain of ligaments of thoracic spine, initial encounter: Secondary | ICD-10-CM | POA: Diagnosis not present

## 2018-11-03 DIAGNOSIS — M533 Sacrococcygeal disorders, not elsewhere classified: Secondary | ICD-10-CM | POA: Diagnosis not present

## 2018-11-08 DIAGNOSIS — M533 Sacrococcygeal disorders, not elsewhere classified: Secondary | ICD-10-CM | POA: Diagnosis not present

## 2018-11-08 DIAGNOSIS — S233XXA Sprain of ligaments of thoracic spine, initial encounter: Secondary | ICD-10-CM | POA: Diagnosis not present

## 2018-11-10 DIAGNOSIS — M5418 Radiculopathy, sacral and sacrococcygeal region: Secondary | ICD-10-CM | POA: Diagnosis not present

## 2018-11-10 DIAGNOSIS — M62838 Other muscle spasm: Secondary | ICD-10-CM | POA: Diagnosis not present

## 2018-11-10 DIAGNOSIS — R278 Other lack of coordination: Secondary | ICD-10-CM | POA: Diagnosis not present

## 2018-11-10 DIAGNOSIS — M6281 Muscle weakness (generalized): Secondary | ICD-10-CM | POA: Diagnosis not present

## 2018-11-11 DIAGNOSIS — M791 Myalgia, unspecified site: Secondary | ICD-10-CM | POA: Diagnosis not present

## 2018-11-11 DIAGNOSIS — M533 Sacrococcygeal disorders, not elsewhere classified: Secondary | ICD-10-CM | POA: Diagnosis not present

## 2018-11-17 DIAGNOSIS — M6281 Muscle weakness (generalized): Secondary | ICD-10-CM | POA: Diagnosis not present

## 2018-11-17 DIAGNOSIS — S233XXA Sprain of ligaments of thoracic spine, initial encounter: Secondary | ICD-10-CM | POA: Diagnosis not present

## 2018-11-17 DIAGNOSIS — R278 Other lack of coordination: Secondary | ICD-10-CM | POA: Diagnosis not present

## 2018-11-17 DIAGNOSIS — M533 Sacrococcygeal disorders, not elsewhere classified: Secondary | ICD-10-CM | POA: Diagnosis not present

## 2018-11-17 DIAGNOSIS — M5418 Radiculopathy, sacral and sacrococcygeal region: Secondary | ICD-10-CM | POA: Diagnosis not present

## 2018-11-22 DIAGNOSIS — S233XXA Sprain of ligaments of thoracic spine, initial encounter: Secondary | ICD-10-CM | POA: Diagnosis not present

## 2018-11-22 DIAGNOSIS — M533 Sacrococcygeal disorders, not elsewhere classified: Secondary | ICD-10-CM | POA: Diagnosis not present

## 2018-11-24 DIAGNOSIS — R278 Other lack of coordination: Secondary | ICD-10-CM | POA: Diagnosis not present

## 2018-11-24 DIAGNOSIS — M6281 Muscle weakness (generalized): Secondary | ICD-10-CM | POA: Diagnosis not present

## 2018-11-24 DIAGNOSIS — M5418 Radiculopathy, sacral and sacrococcygeal region: Secondary | ICD-10-CM | POA: Diagnosis not present

## 2018-11-25 DIAGNOSIS — D229 Melanocytic nevi, unspecified: Secondary | ICD-10-CM | POA: Diagnosis not present

## 2018-11-25 DIAGNOSIS — L309 Dermatitis, unspecified: Secondary | ICD-10-CM | POA: Diagnosis not present

## 2018-12-01 DIAGNOSIS — M5418 Radiculopathy, sacral and sacrococcygeal region: Secondary | ICD-10-CM | POA: Diagnosis not present

## 2018-12-01 DIAGNOSIS — R278 Other lack of coordination: Secondary | ICD-10-CM | POA: Diagnosis not present

## 2018-12-01 DIAGNOSIS — M6281 Muscle weakness (generalized): Secondary | ICD-10-CM | POA: Diagnosis not present

## 2018-12-02 DIAGNOSIS — M533 Sacrococcygeal disorders, not elsewhere classified: Secondary | ICD-10-CM | POA: Diagnosis not present

## 2018-12-02 DIAGNOSIS — S233XXA Sprain of ligaments of thoracic spine, initial encounter: Secondary | ICD-10-CM | POA: Diagnosis not present

## 2018-12-08 DIAGNOSIS — M5418 Radiculopathy, sacral and sacrococcygeal region: Secondary | ICD-10-CM | POA: Diagnosis not present

## 2018-12-08 DIAGNOSIS — M6281 Muscle weakness (generalized): Secondary | ICD-10-CM | POA: Diagnosis not present

## 2018-12-08 DIAGNOSIS — R278 Other lack of coordination: Secondary | ICD-10-CM | POA: Diagnosis not present

## 2018-12-09 DIAGNOSIS — M533 Sacrococcygeal disorders, not elsewhere classified: Secondary | ICD-10-CM | POA: Diagnosis not present

## 2018-12-09 DIAGNOSIS — S233XXA Sprain of ligaments of thoracic spine, initial encounter: Secondary | ICD-10-CM | POA: Diagnosis not present

## 2018-12-16 DIAGNOSIS — F331 Major depressive disorder, recurrent, moderate: Secondary | ICD-10-CM | POA: Diagnosis not present

## 2018-12-24 DIAGNOSIS — F331 Major depressive disorder, recurrent, moderate: Secondary | ICD-10-CM | POA: Diagnosis not present

## 2018-12-27 DIAGNOSIS — F329 Major depressive disorder, single episode, unspecified: Secondary | ICD-10-CM | POA: Diagnosis not present

## 2018-12-27 DIAGNOSIS — E349 Endocrine disorder, unspecified: Secondary | ICD-10-CM | POA: Diagnosis not present

## 2018-12-30 DIAGNOSIS — M6281 Muscle weakness (generalized): Secondary | ICD-10-CM | POA: Diagnosis not present

## 2018-12-30 DIAGNOSIS — R278 Other lack of coordination: Secondary | ICD-10-CM | POA: Diagnosis not present

## 2018-12-30 DIAGNOSIS — M5418 Radiculopathy, sacral and sacrococcygeal region: Secondary | ICD-10-CM | POA: Diagnosis not present

## 2019-01-05 DIAGNOSIS — F331 Major depressive disorder, recurrent, moderate: Secondary | ICD-10-CM | POA: Diagnosis not present

## 2019-01-06 DIAGNOSIS — R278 Other lack of coordination: Secondary | ICD-10-CM | POA: Diagnosis not present

## 2019-01-06 DIAGNOSIS — M6281 Muscle weakness (generalized): Secondary | ICD-10-CM | POA: Diagnosis not present

## 2019-01-06 DIAGNOSIS — M5418 Radiculopathy, sacral and sacrococcygeal region: Secondary | ICD-10-CM | POA: Diagnosis not present

## 2019-01-12 DIAGNOSIS — M6281 Muscle weakness (generalized): Secondary | ICD-10-CM | POA: Diagnosis not present

## 2019-01-12 DIAGNOSIS — R278 Other lack of coordination: Secondary | ICD-10-CM | POA: Diagnosis not present

## 2019-01-12 DIAGNOSIS — M5418 Radiculopathy, sacral and sacrococcygeal region: Secondary | ICD-10-CM | POA: Diagnosis not present

## 2019-01-19 DIAGNOSIS — R278 Other lack of coordination: Secondary | ICD-10-CM | POA: Diagnosis not present

## 2019-01-19 DIAGNOSIS — M6281 Muscle weakness (generalized): Secondary | ICD-10-CM | POA: Diagnosis not present

## 2019-01-19 DIAGNOSIS — M5418 Radiculopathy, sacral and sacrococcygeal region: Secondary | ICD-10-CM | POA: Diagnosis not present

## 2019-01-26 DIAGNOSIS — M6281 Muscle weakness (generalized): Secondary | ICD-10-CM | POA: Diagnosis not present

## 2019-01-26 DIAGNOSIS — M5418 Radiculopathy, sacral and sacrococcygeal region: Secondary | ICD-10-CM | POA: Diagnosis not present

## 2019-01-26 DIAGNOSIS — R278 Other lack of coordination: Secondary | ICD-10-CM | POA: Diagnosis not present

## 2019-02-02 DIAGNOSIS — R278 Other lack of coordination: Secondary | ICD-10-CM | POA: Diagnosis not present

## 2019-02-02 DIAGNOSIS — M6281 Muscle weakness (generalized): Secondary | ICD-10-CM | POA: Diagnosis not present

## 2019-02-02 DIAGNOSIS — M5418 Radiculopathy, sacral and sacrococcygeal region: Secondary | ICD-10-CM | POA: Diagnosis not present

## 2019-02-03 DIAGNOSIS — F331 Major depressive disorder, recurrent, moderate: Secondary | ICD-10-CM | POA: Diagnosis not present

## 2019-02-07 DIAGNOSIS — R278 Other lack of coordination: Secondary | ICD-10-CM | POA: Diagnosis not present

## 2019-02-07 DIAGNOSIS — M6281 Muscle weakness (generalized): Secondary | ICD-10-CM | POA: Diagnosis not present

## 2019-02-07 DIAGNOSIS — M5418 Radiculopathy, sacral and sacrococcygeal region: Secondary | ICD-10-CM | POA: Diagnosis not present

## 2019-02-16 DIAGNOSIS — R278 Other lack of coordination: Secondary | ICD-10-CM | POA: Diagnosis not present

## 2019-02-16 DIAGNOSIS — M6281 Muscle weakness (generalized): Secondary | ICD-10-CM | POA: Diagnosis not present

## 2019-02-16 DIAGNOSIS — M5418 Radiculopathy, sacral and sacrococcygeal region: Secondary | ICD-10-CM | POA: Diagnosis not present

## 2019-02-22 ENCOUNTER — Telehealth: Payer: Self-pay | Admitting: Family Medicine

## 2019-02-22 NOTE — Telephone Encounter (Signed)
Pt with post nasal drip, been a problem since September.  Pt's OB told her that it could be from pregnancy but now pt is more than 6 months postpartum & still having issues  Pt's tried Allegra, Claritin, Zyrtec, & Xyzal - no relief  Suggestions?  Please advise & call pt     [facility]

## 2019-02-22 NOTE — Telephone Encounter (Signed)
Add flonase two sprays ea nost s ix mo worth

## 2019-02-22 NOTE — Telephone Encounter (Signed)
Pt states she has tried that also but forgot to mention it when she called. The only thing she states she has not tried is the nettie rinse.

## 2019-02-23 ENCOUNTER — Other Ambulatory Visit: Payer: Self-pay | Admitting: *Deleted

## 2019-02-23 DIAGNOSIS — M6281 Muscle weakness (generalized): Secondary | ICD-10-CM | POA: Diagnosis not present

## 2019-02-23 DIAGNOSIS — M5418 Radiculopathy, sacral and sacrococcygeal region: Secondary | ICD-10-CM | POA: Diagnosis not present

## 2019-02-23 DIAGNOSIS — R278 Other lack of coordination: Secondary | ICD-10-CM | POA: Diagnosis not present

## 2019-02-23 MED ORDER — AZELASTINE HCL 0.1 % NA SOLN: 2.0000 | Freq: Two times a day (BID) | NASAL | 11 refills | Status: DC

## 2019-02-23 NOTE — Telephone Encounter (Signed)
Discussed with pt and med sent to pharm.  

## 2019-02-23 NOTE — Telephone Encounter (Signed)
Astelin spray.  2 sprays each nostril twice daily.  Refill as needed

## 2019-03-02 DIAGNOSIS — R278 Other lack of coordination: Secondary | ICD-10-CM | POA: Diagnosis not present

## 2019-03-02 DIAGNOSIS — M6281 Muscle weakness (generalized): Secondary | ICD-10-CM | POA: Diagnosis not present

## 2019-03-02 DIAGNOSIS — M5418 Radiculopathy, sacral and sacrococcygeal region: Secondary | ICD-10-CM | POA: Diagnosis not present

## 2019-03-09 DIAGNOSIS — M6281 Muscle weakness (generalized): Secondary | ICD-10-CM | POA: Diagnosis not present

## 2019-03-09 DIAGNOSIS — R278 Other lack of coordination: Secondary | ICD-10-CM | POA: Diagnosis not present

## 2019-03-09 DIAGNOSIS — M5418 Radiculopathy, sacral and sacrococcygeal region: Secondary | ICD-10-CM | POA: Diagnosis not present

## 2019-03-23 DIAGNOSIS — R278 Other lack of coordination: Secondary | ICD-10-CM | POA: Diagnosis not present

## 2019-03-23 DIAGNOSIS — M5418 Radiculopathy, sacral and sacrococcygeal region: Secondary | ICD-10-CM | POA: Diagnosis not present

## 2019-03-23 DIAGNOSIS — M6281 Muscle weakness (generalized): Secondary | ICD-10-CM | POA: Diagnosis not present

## 2019-04-06 DIAGNOSIS — R278 Other lack of coordination: Secondary | ICD-10-CM | POA: Diagnosis not present

## 2019-04-06 DIAGNOSIS — M6281 Muscle weakness (generalized): Secondary | ICD-10-CM | POA: Diagnosis not present

## 2019-04-06 DIAGNOSIS — M5418 Radiculopathy, sacral and sacrococcygeal region: Secondary | ICD-10-CM | POA: Diagnosis not present

## 2019-04-18 DIAGNOSIS — M533 Sacrococcygeal disorders, not elsewhere classified: Secondary | ICD-10-CM | POA: Diagnosis not present

## 2019-04-18 DIAGNOSIS — S233XXA Sprain of ligaments of thoracic spine, initial encounter: Secondary | ICD-10-CM | POA: Diagnosis not present

## 2019-04-21 DIAGNOSIS — R278 Other lack of coordination: Secondary | ICD-10-CM | POA: Diagnosis not present

## 2019-04-21 DIAGNOSIS — M6281 Muscle weakness (generalized): Secondary | ICD-10-CM | POA: Diagnosis not present

## 2019-04-21 DIAGNOSIS — M5418 Radiculopathy, sacral and sacrococcygeal region: Secondary | ICD-10-CM | POA: Diagnosis not present

## 2019-05-04 DIAGNOSIS — M5418 Radiculopathy, sacral and sacrococcygeal region: Secondary | ICD-10-CM | POA: Diagnosis not present

## 2019-05-04 DIAGNOSIS — M6281 Muscle weakness (generalized): Secondary | ICD-10-CM | POA: Diagnosis not present

## 2019-05-04 DIAGNOSIS — R278 Other lack of coordination: Secondary | ICD-10-CM | POA: Diagnosis not present

## 2019-05-24 DIAGNOSIS — R278 Other lack of coordination: Secondary | ICD-10-CM | POA: Diagnosis not present

## 2019-05-24 DIAGNOSIS — M5418 Radiculopathy, sacral and sacrococcygeal region: Secondary | ICD-10-CM | POA: Diagnosis not present

## 2019-05-24 DIAGNOSIS — M6281 Muscle weakness (generalized): Secondary | ICD-10-CM | POA: Diagnosis not present

## 2019-06-01 DIAGNOSIS — M5418 Radiculopathy, sacral and sacrococcygeal region: Secondary | ICD-10-CM | POA: Diagnosis not present

## 2019-06-01 DIAGNOSIS — M6281 Muscle weakness (generalized): Secondary | ICD-10-CM | POA: Diagnosis not present

## 2019-06-01 DIAGNOSIS — R278 Other lack of coordination: Secondary | ICD-10-CM | POA: Diagnosis not present

## 2019-06-14 DIAGNOSIS — R278 Other lack of coordination: Secondary | ICD-10-CM | POA: Diagnosis not present

## 2019-06-14 DIAGNOSIS — M5418 Radiculopathy, sacral and sacrococcygeal region: Secondary | ICD-10-CM | POA: Diagnosis not present

## 2019-06-14 DIAGNOSIS — M6281 Muscle weakness (generalized): Secondary | ICD-10-CM | POA: Diagnosis not present

## 2019-06-22 DIAGNOSIS — N941 Unspecified dyspareunia: Secondary | ICD-10-CM | POA: Diagnosis not present

## 2019-06-22 DIAGNOSIS — Z3202 Encounter for pregnancy test, result negative: Secondary | ICD-10-CM | POA: Diagnosis not present

## 2019-06-22 DIAGNOSIS — Z01419 Encounter for gynecological examination (general) (routine) without abnormal findings: Secondary | ICD-10-CM | POA: Diagnosis not present

## 2019-06-22 DIAGNOSIS — N926 Irregular menstruation, unspecified: Secondary | ICD-10-CM | POA: Diagnosis not present

## 2019-06-22 DIAGNOSIS — Z6837 Body mass index (BMI) 37.0-37.9, adult: Secondary | ICD-10-CM | POA: Diagnosis not present

## 2019-06-22 DIAGNOSIS — Z13220 Encounter for screening for lipoid disorders: Secondary | ICD-10-CM | POA: Diagnosis not present

## 2019-06-22 DIAGNOSIS — F329 Major depressive disorder, single episode, unspecified: Secondary | ICD-10-CM | POA: Diagnosis not present

## 2019-06-22 DIAGNOSIS — Z113 Encounter for screening for infections with a predominantly sexual mode of transmission: Secondary | ICD-10-CM | POA: Diagnosis not present

## 2019-07-07 DIAGNOSIS — N941 Unspecified dyspareunia: Secondary | ICD-10-CM | POA: Diagnosis not present

## 2019-07-07 DIAGNOSIS — Z3046 Encounter for surveillance of implantable subdermal contraceptive: Secondary | ICD-10-CM | POA: Diagnosis not present

## 2019-07-07 DIAGNOSIS — F329 Major depressive disorder, single episode, unspecified: Secondary | ICD-10-CM | POA: Diagnosis not present

## 2019-07-07 DIAGNOSIS — Z309 Encounter for contraceptive management, unspecified: Secondary | ICD-10-CM | POA: Diagnosis not present

## 2019-07-18 IMAGING — DX DG PELVIS 1-2V
1 series · 1 of 1 positions shown · non-contrast
Comparison: None.

CLINICAL DATA: Coccygeal pain 2 months following childbirth,
initial encounter

EXAM:
PELVIS - 1-2 VIEW

[pelvis ap]
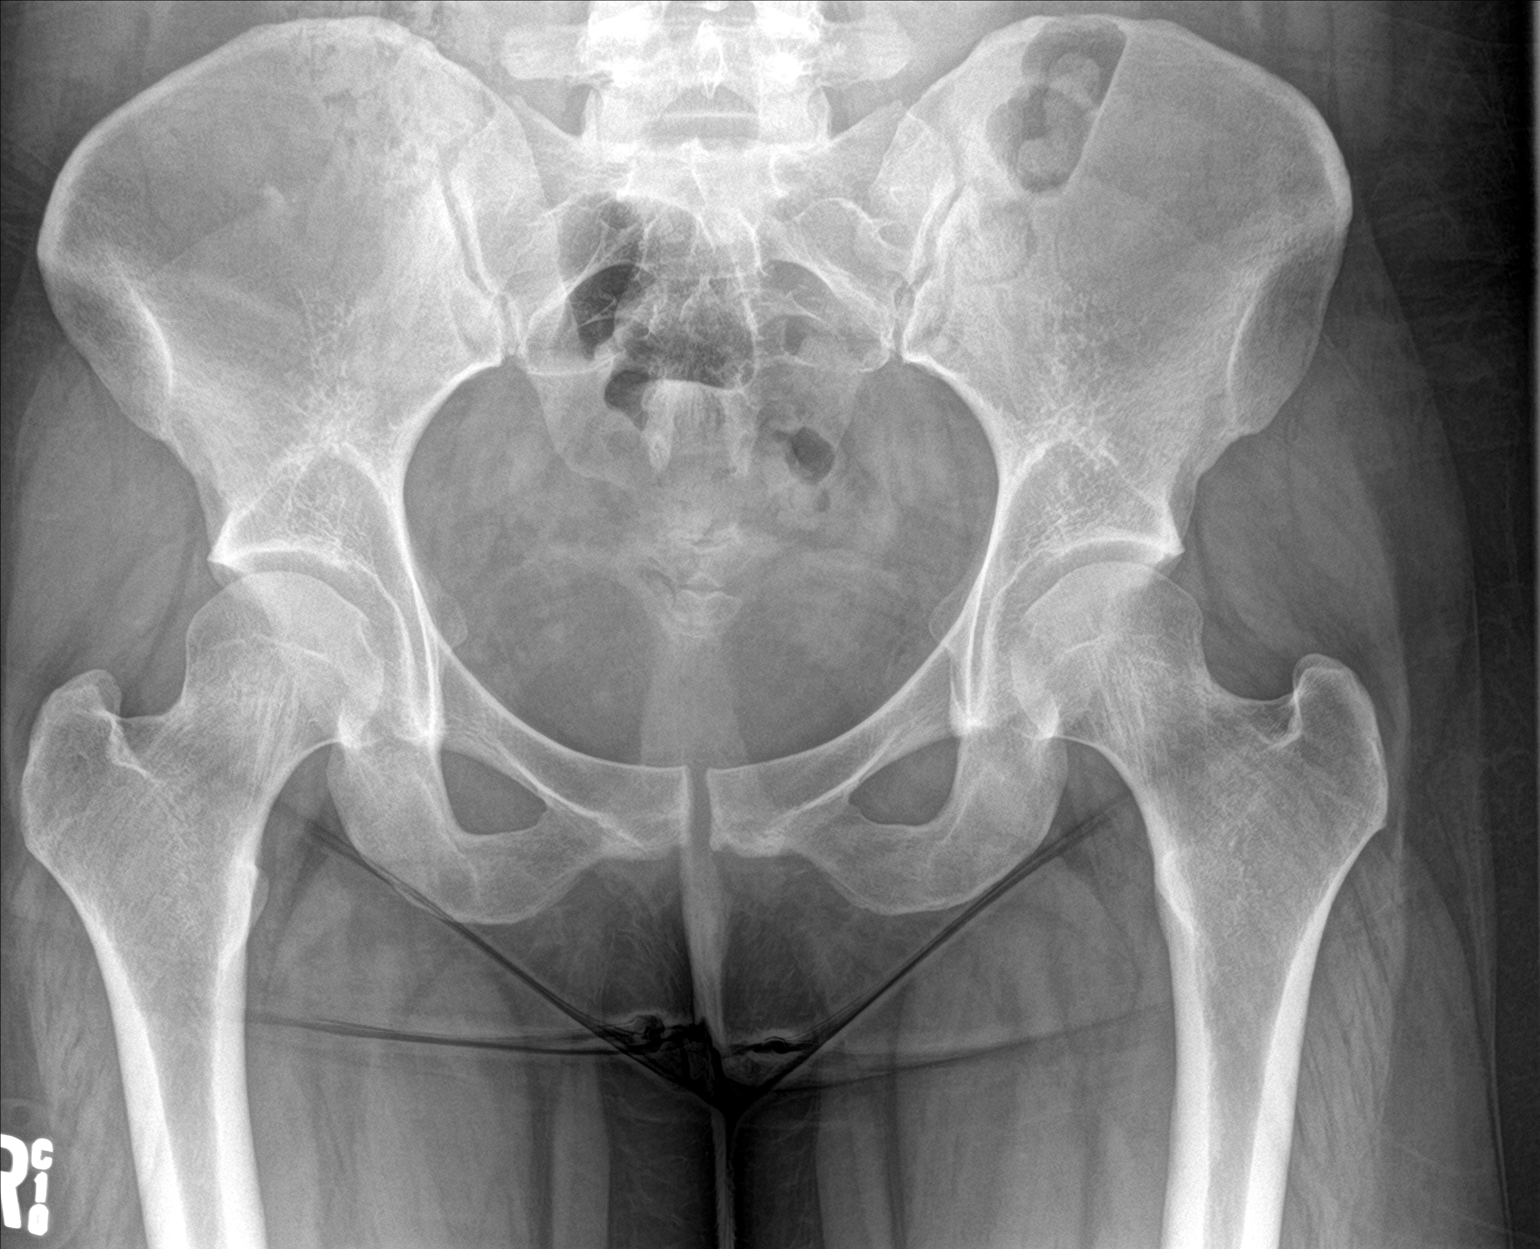

[1 of 1 positions shown; findings below may reference images not displayed]

FINDINGS: There is no evidence of pelvic fracture or diastasis. No pelvic bone
lesions are seen.
IMPRESSION: No acute abnormality noted.

## 2019-07-27 DIAGNOSIS — M5418 Radiculopathy, sacral and sacrococcygeal region: Secondary | ICD-10-CM | POA: Diagnosis not present

## 2019-07-27 DIAGNOSIS — M6281 Muscle weakness (generalized): Secondary | ICD-10-CM | POA: Diagnosis not present

## 2019-07-27 DIAGNOSIS — R278 Other lack of coordination: Secondary | ICD-10-CM | POA: Diagnosis not present

## 2019-08-03 ENCOUNTER — Encounter: Payer: Self-pay | Admitting: Family Medicine

## 2019-08-04 ENCOUNTER — Encounter: Payer: Self-pay | Admitting: Family Medicine

## 2019-08-08 ENCOUNTER — Other Ambulatory Visit: Payer: Self-pay | Admitting: Family Medicine

## 2019-08-08 NOTE — Telephone Encounter (Signed)
Scheduled 2/10

## 2019-08-08 NOTE — Telephone Encounter (Signed)
Please contact patient and set up appt. Then may route back to nurses

## 2019-08-17 ENCOUNTER — Other Ambulatory Visit: Payer: Self-pay

## 2019-08-17 ENCOUNTER — Ambulatory Visit (INDEPENDENT_AMBULATORY_CARE_PROVIDER_SITE_OTHER): Payer: BC Managed Care – PPO | Admitting: Family Medicine

## 2019-08-17 DIAGNOSIS — K2100 Gastro-esophageal reflux disease with esophagitis, without bleeding: Secondary | ICD-10-CM

## 2019-08-17 MED ORDER — PANTOPRAZOLE SODIUM 40 MG PO TBEC: DELAYED_RELEASE_TABLET | ORAL | 3 refills | Status: DC

## 2019-08-17 NOTE — Progress Notes (Signed)
   Subjective:    Patient ID: Brandi Oconnell, female    DOB: 20-Oct-1990, 29 y.o.   MRN: [id number]  HPI Patient calls for a follow up on reflux. Patient states she is doing well on current med.  Virtual Visit via Video Note  I connected with Jeannine Boga on 08/17/19 at  8:30 AM EST by a video enabled telemedicine application and verified that I am speaking with the correct person using two identifiers.  Location: Patient: home Provider: office   I discussed the limitations of evaluation and management by telemedicine and the availability of in person appointments. The patient expressed understanding and agreed to proceed.  History of Present Illness:    Observations/Objective:   Assessment and Plan:   Follow Up Instructions:    I discussed the assessment and treatment plan with the patient. The patient was provided an opportunity to ask questions and all were answered. The patient agreed with the plan and demonstrated an understanding of the instructions.   The patient was advised to call back or seek an in-person evaluation if the symptoms worsen or if the condition fails to improve as anticipated.  I provided 20 minutes of non-face-to-face time during this encounter.   Patient notes reflux has become more of an issue since she had her baby.  She has added on weight.  Having some challenges with exercise and diet.  Limits her caffeine intake.  Spicy foods does seem to flareup.  This can occur anytime of day or evening.  When she comes off the medicine it flares up and causes significant problems    Review of Systems No headache no chest pain no shortness of breath    Objective:   Physical Exam   Virtual     Assessment & Plan:  Impression reflux.  Discussed at length.  Will maintain Protonix.  Patient should try every now and then to wean off and go down to something like Pepcid 20 daily.  Rationale discussed.

## 2019-08-30 DIAGNOSIS — J3081 Allergic rhinitis due to animal (cat) (dog) hair and dander: Secondary | ICD-10-CM | POA: Diagnosis not present

## 2019-08-30 DIAGNOSIS — K219 Gastro-esophageal reflux disease without esophagitis: Secondary | ICD-10-CM | POA: Diagnosis not present

## 2019-08-30 DIAGNOSIS — J3089 Other allergic rhinitis: Secondary | ICD-10-CM | POA: Diagnosis not present

## 2019-08-30 DIAGNOSIS — J301 Allergic rhinitis due to pollen: Secondary | ICD-10-CM | POA: Diagnosis not present

## 2019-10-24 DIAGNOSIS — F329 Major depressive disorder, single episode, unspecified: Secondary | ICD-10-CM | POA: Diagnosis not present

## 2019-10-24 DIAGNOSIS — N941 Unspecified dyspareunia: Secondary | ICD-10-CM | POA: Diagnosis not present

## 2019-10-26 ENCOUNTER — Other Ambulatory Visit: Payer: Self-pay

## 2019-10-26 ENCOUNTER — Ambulatory Visit: Payer: BC Managed Care – PPO | Admitting: Family Medicine

## 2019-10-26 ENCOUNTER — Encounter: Payer: Self-pay | Admitting: Family Medicine

## 2019-10-26 VITALS — BP 110/72 | Temp 98.3°F | Ht 64.5 in | Wt 227.0 lb

## 2019-10-26 DIAGNOSIS — F419 Anxiety disorder, unspecified: Secondary | ICD-10-CM | POA: Diagnosis not present

## 2019-10-26 DIAGNOSIS — F339 Major depressive disorder, recurrent, unspecified: Secondary | ICD-10-CM

## 2019-10-26 MED ORDER — ESCITALOPRAM OXALATE 10 MG PO TABS: 10.0000 mg | ORAL_TABLET | Freq: Every morning | ORAL | 2 refills | Status: DC

## 2019-10-26 NOTE — Progress Notes (Signed)
   Subjective:  Patient arrives for a protracted discussion  Patient ID: Brandi Oconnell, female    DOB: January 27, 1991, 29 y.o.   MRN: [id number]  HPIpt arrives today to discuss stress. phq9 and gad 7 done.   Pt developed pos partum depr  And decloned meds at that time  Has been workong at home and way from work  Pt feeling worn down   Has reached out to her employer , [employer], they offered counselling therapy sessions   The counselor brought up the idea of medicine  Pt reluctant to take meds     Review of Systems No headache, no major weight loss or weight gain, no chest pain no back pain abdominal pain no change in bowel habits complete ROS otherwise negative     Objective:   Physical Exam Alert vitals stable, NAD. Blood pressure good on repeat. HEENT normal. Lungs clear. Heart regular rate and rhythm.        Assessment & Plan:  Impression generalized anxiety disorder.  Patient fits criteria.  See GAD-7 results.  Discussed at great length  2.  Moderate depression.  Thankfully no suicidal thoughts.  Patient has a endogenous tendency towards this.  Had postpartum depression with her last child.  Has had a lot of stress especially with Covid.  Is inserted counseling arrangement.  Notes depression is very substantial.  Has been reluctant to take medication.  Medication discussed at great length.  Pros and cons.  We will press on with Lexapro 10 mg every morning.  3 months worth.  Encouraged to maintain counseling.  Will work with psychiatry referral.  Numerous questions answered  Risk Evaluation and Mitigation Strategies (REMS)  Greater than 50% of this 30 minute face to face visit was spent in counseling and discussion and coordination of care regarding the above diagnosis/diagnosies

## 2019-10-27 ENCOUNTER — Telehealth: Payer: Self-pay | Admitting: Family Medicine

## 2019-10-27 NOTE — Telephone Encounter (Signed)
Pt states that pharmacy told her that we were going to send in something else. Per Office note, start with Lexapro 10 mg every morning 3 months worth.. Informed pt that I would call pharmacy and let them know. Pt verbalized understanding. Contacted pharmacy to let them know lexapro 10 mg every morning

## 2019-10-27 NOTE — Telephone Encounter (Signed)
Pt was seen yesterday and went to pharmacy to pick up medication. When she got to pharmacy they state they was told to not fill that medication another one would be called in. Pt states they only discussed one medication so wants to confirm what medication is being sent to pharmacy.

## 2019-11-01 ENCOUNTER — Encounter: Payer: Self-pay | Admitting: Family Medicine

## 2019-12-07 DIAGNOSIS — N951 Menopausal and female climacteric states: Secondary | ICD-10-CM | POA: Diagnosis not present

## 2019-12-07 DIAGNOSIS — R5382 Chronic fatigue, unspecified: Secondary | ICD-10-CM | POA: Diagnosis not present

## 2019-12-12 DIAGNOSIS — F419 Anxiety disorder, unspecified: Secondary | ICD-10-CM | POA: Diagnosis not present

## 2019-12-12 DIAGNOSIS — N951 Menopausal and female climacteric states: Secondary | ICD-10-CM | POA: Diagnosis not present

## 2019-12-12 DIAGNOSIS — M2559 Pain in other specified joint: Secondary | ICD-10-CM | POA: Diagnosis not present

## 2019-12-12 DIAGNOSIS — R5382 Chronic fatigue, unspecified: Secondary | ICD-10-CM | POA: Diagnosis not present

## 2019-12-16 DIAGNOSIS — R5382 Chronic fatigue, unspecified: Secondary | ICD-10-CM | POA: Diagnosis not present

## 2019-12-16 DIAGNOSIS — N951 Menopausal and female climacteric states: Secondary | ICD-10-CM | POA: Diagnosis not present

## 2020-01-23 DIAGNOSIS — R5382 Chronic fatigue, unspecified: Secondary | ICD-10-CM | POA: Diagnosis not present

## 2020-01-23 DIAGNOSIS — N951 Menopausal and female climacteric states: Secondary | ICD-10-CM | POA: Diagnosis not present

## 2020-01-25 DIAGNOSIS — N951 Menopausal and female climacteric states: Secondary | ICD-10-CM | POA: Diagnosis not present

## 2020-01-25 DIAGNOSIS — R5382 Chronic fatigue, unspecified: Secondary | ICD-10-CM | POA: Diagnosis not present

## 2020-02-02 DIAGNOSIS — K219 Gastro-esophageal reflux disease without esophagitis: Secondary | ICD-10-CM | POA: Diagnosis not present

## 2020-03-23 ENCOUNTER — Encounter: Payer: Self-pay | Admitting: Gastroenterology

## 2020-03-27 DIAGNOSIS — R5382 Chronic fatigue, unspecified: Secondary | ICD-10-CM | POA: Diagnosis not present

## 2020-03-27 DIAGNOSIS — N951 Menopausal and female climacteric states: Secondary | ICD-10-CM | POA: Diagnosis not present

## 2020-03-29 DIAGNOSIS — R6882 Decreased libido: Secondary | ICD-10-CM | POA: Diagnosis not present

## 2020-03-29 DIAGNOSIS — N951 Menopausal and female climacteric states: Secondary | ICD-10-CM | POA: Diagnosis not present

## 2020-03-29 DIAGNOSIS — Z6834 Body mass index (BMI) 34.0-34.9, adult: Secondary | ICD-10-CM | POA: Diagnosis not present

## 2020-03-29 DIAGNOSIS — Z789 Other specified health status: Secondary | ICD-10-CM | POA: Diagnosis not present

## 2020-04-05 DIAGNOSIS — K219 Gastro-esophageal reflux disease without esophagitis: Secondary | ICD-10-CM | POA: Diagnosis not present

## 2020-06-04 ENCOUNTER — Ambulatory Visit: Payer: BLUE CROSS/BLUE SHIELD | Admitting: Gastroenterology

## 2020-06-22 DIAGNOSIS — Z01419 Encounter for gynecological examination (general) (routine) without abnormal findings: Secondary | ICD-10-CM | POA: Diagnosis not present

## 2020-06-22 DIAGNOSIS — Z131 Encounter for screening for diabetes mellitus: Secondary | ICD-10-CM | POA: Diagnosis not present

## 2020-06-22 DIAGNOSIS — Z1329 Encounter for screening for other suspected endocrine disorder: Secondary | ICD-10-CM | POA: Diagnosis not present

## 2020-06-22 DIAGNOSIS — Z1321 Encounter for screening for nutritional disorder: Secondary | ICD-10-CM | POA: Diagnosis not present

## 2020-06-22 DIAGNOSIS — Z13228 Encounter for screening for other metabolic disorders: Secondary | ICD-10-CM | POA: Diagnosis not present

## 2020-06-22 DIAGNOSIS — Z6834 Body mass index (BMI) 34.0-34.9, adult: Secondary | ICD-10-CM | POA: Diagnosis not present

## 2020-07-03 ENCOUNTER — Telehealth: Payer: Self-pay

## 2020-07-03 DIAGNOSIS — K30 Functional dyspepsia: Secondary | ICD-10-CM | POA: Diagnosis not present

## 2020-07-03 DIAGNOSIS — K219 Gastro-esophageal reflux disease without esophagitis: Secondary | ICD-10-CM | POA: Diagnosis not present

## 2020-07-03 NOTE — Telephone Encounter (Signed)
Please contact pt to have her set up appt with Gigi Gin or Dr.Taylor. thank you!

## 2020-07-03 NOTE — Telephone Encounter (Signed)
Patient is getting ready to go on a trip to the mountains and gets really car sick when going around the curves, wanted to know if she could get a couple of the motion sickness patches called in?  [facility] pharmacy

## 2020-07-03 NOTE — Telephone Encounter (Signed)
Please advise. Thank you

## 2020-07-03 NOTE — Telephone Encounter (Signed)
Needs video or phone visit, hasn't established care with me yet.  Can see me or karen or carolyn.   Thx. Dr. Ladona Ridgel

## 2020-07-04 DIAGNOSIS — K219 Gastro-esophageal reflux disease without esophagitis: Secondary | ICD-10-CM | POA: Diagnosis not present

## 2020-07-16 DIAGNOSIS — M9903 Segmental and somatic dysfunction of lumbar region: Secondary | ICD-10-CM | POA: Diagnosis not present

## 2020-07-16 DIAGNOSIS — M9901 Segmental and somatic dysfunction of cervical region: Secondary | ICD-10-CM | POA: Diagnosis not present

## 2020-07-16 DIAGNOSIS — M9902 Segmental and somatic dysfunction of thoracic region: Secondary | ICD-10-CM | POA: Diagnosis not present

## 2020-07-16 DIAGNOSIS — M530 Cervicocranial syndrome: Secondary | ICD-10-CM | POA: Diagnosis not present

## 2020-07-17 DIAGNOSIS — M9901 Segmental and somatic dysfunction of cervical region: Secondary | ICD-10-CM | POA: Diagnosis not present

## 2020-07-17 DIAGNOSIS — M9903 Segmental and somatic dysfunction of lumbar region: Secondary | ICD-10-CM | POA: Diagnosis not present

## 2020-07-17 DIAGNOSIS — M530 Cervicocranial syndrome: Secondary | ICD-10-CM | POA: Diagnosis not present

## 2020-07-17 DIAGNOSIS — M9902 Segmental and somatic dysfunction of thoracic region: Secondary | ICD-10-CM | POA: Diagnosis not present

## 2020-07-18 DIAGNOSIS — M9902 Segmental and somatic dysfunction of thoracic region: Secondary | ICD-10-CM | POA: Diagnosis not present

## 2020-07-18 DIAGNOSIS — M9901 Segmental and somatic dysfunction of cervical region: Secondary | ICD-10-CM | POA: Diagnosis not present

## 2020-07-18 DIAGNOSIS — M530 Cervicocranial syndrome: Secondary | ICD-10-CM | POA: Diagnosis not present

## 2020-07-18 DIAGNOSIS — M9903 Segmental and somatic dysfunction of lumbar region: Secondary | ICD-10-CM | POA: Diagnosis not present

## 2020-07-25 DIAGNOSIS — M9901 Segmental and somatic dysfunction of cervical region: Secondary | ICD-10-CM | POA: Diagnosis not present

## 2020-07-25 DIAGNOSIS — M9902 Segmental and somatic dysfunction of thoracic region: Secondary | ICD-10-CM | POA: Diagnosis not present

## 2020-07-25 DIAGNOSIS — M530 Cervicocranial syndrome: Secondary | ICD-10-CM | POA: Diagnosis not present

## 2020-07-25 DIAGNOSIS — M9903 Segmental and somatic dysfunction of lumbar region: Secondary | ICD-10-CM | POA: Diagnosis not present

## 2020-07-26 DIAGNOSIS — M530 Cervicocranial syndrome: Secondary | ICD-10-CM | POA: Diagnosis not present

## 2020-07-26 DIAGNOSIS — M9903 Segmental and somatic dysfunction of lumbar region: Secondary | ICD-10-CM | POA: Diagnosis not present

## 2020-07-26 DIAGNOSIS — M9902 Segmental and somatic dysfunction of thoracic region: Secondary | ICD-10-CM | POA: Diagnosis not present

## 2020-07-26 DIAGNOSIS — M9901 Segmental and somatic dysfunction of cervical region: Secondary | ICD-10-CM | POA: Diagnosis not present

## 2020-07-27 DIAGNOSIS — M9903 Segmental and somatic dysfunction of lumbar region: Secondary | ICD-10-CM | POA: Diagnosis not present

## 2020-07-27 DIAGNOSIS — M530 Cervicocranial syndrome: Secondary | ICD-10-CM | POA: Diagnosis not present

## 2020-07-27 DIAGNOSIS — M9902 Segmental and somatic dysfunction of thoracic region: Secondary | ICD-10-CM | POA: Diagnosis not present

## 2020-07-27 DIAGNOSIS — M9901 Segmental and somatic dysfunction of cervical region: Secondary | ICD-10-CM | POA: Diagnosis not present

## 2020-07-30 DIAGNOSIS — M9901 Segmental and somatic dysfunction of cervical region: Secondary | ICD-10-CM | POA: Diagnosis not present

## 2020-07-30 DIAGNOSIS — M530 Cervicocranial syndrome: Secondary | ICD-10-CM | POA: Diagnosis not present

## 2020-07-30 DIAGNOSIS — M9902 Segmental and somatic dysfunction of thoracic region: Secondary | ICD-10-CM | POA: Diagnosis not present

## 2020-07-30 DIAGNOSIS — N951 Menopausal and female climacteric states: Secondary | ICD-10-CM | POA: Diagnosis not present

## 2020-07-30 DIAGNOSIS — M9903 Segmental and somatic dysfunction of lumbar region: Secondary | ICD-10-CM | POA: Diagnosis not present

## 2020-08-01 DIAGNOSIS — M9903 Segmental and somatic dysfunction of lumbar region: Secondary | ICD-10-CM | POA: Diagnosis not present

## 2020-08-01 DIAGNOSIS — R6882 Decreased libido: Secondary | ICD-10-CM | POA: Diagnosis not present

## 2020-08-01 DIAGNOSIS — N951 Menopausal and female climacteric states: Secondary | ICD-10-CM | POA: Diagnosis not present

## 2020-08-01 DIAGNOSIS — F324 Major depressive disorder, single episode, in partial remission: Secondary | ICD-10-CM | POA: Diagnosis not present

## 2020-08-01 DIAGNOSIS — M530 Cervicocranial syndrome: Secondary | ICD-10-CM | POA: Diagnosis not present

## 2020-08-01 DIAGNOSIS — M9901 Segmental and somatic dysfunction of cervical region: Secondary | ICD-10-CM | POA: Diagnosis not present

## 2020-08-01 DIAGNOSIS — M9902 Segmental and somatic dysfunction of thoracic region: Secondary | ICD-10-CM | POA: Diagnosis not present

## 2020-08-01 DIAGNOSIS — K219 Gastro-esophageal reflux disease without esophagitis: Secondary | ICD-10-CM | POA: Diagnosis not present

## 2020-08-02 DIAGNOSIS — M9902 Segmental and somatic dysfunction of thoracic region: Secondary | ICD-10-CM | POA: Diagnosis not present

## 2020-08-02 DIAGNOSIS — M9901 Segmental and somatic dysfunction of cervical region: Secondary | ICD-10-CM | POA: Diagnosis not present

## 2020-08-02 DIAGNOSIS — M530 Cervicocranial syndrome: Secondary | ICD-10-CM | POA: Diagnosis not present

## 2020-08-02 DIAGNOSIS — M9903 Segmental and somatic dysfunction of lumbar region: Secondary | ICD-10-CM | POA: Diagnosis not present

## 2020-08-08 DIAGNOSIS — M9901 Segmental and somatic dysfunction of cervical region: Secondary | ICD-10-CM | POA: Diagnosis not present

## 2020-08-08 DIAGNOSIS — M9902 Segmental and somatic dysfunction of thoracic region: Secondary | ICD-10-CM | POA: Diagnosis not present

## 2020-08-08 DIAGNOSIS — M9903 Segmental and somatic dysfunction of lumbar region: Secondary | ICD-10-CM | POA: Diagnosis not present

## 2020-08-08 DIAGNOSIS — M530 Cervicocranial syndrome: Secondary | ICD-10-CM | POA: Diagnosis not present

## 2020-08-09 DIAGNOSIS — M530 Cervicocranial syndrome: Secondary | ICD-10-CM | POA: Diagnosis not present

## 2020-08-09 DIAGNOSIS — M9901 Segmental and somatic dysfunction of cervical region: Secondary | ICD-10-CM | POA: Diagnosis not present

## 2020-08-09 DIAGNOSIS — M9902 Segmental and somatic dysfunction of thoracic region: Secondary | ICD-10-CM | POA: Diagnosis not present

## 2020-08-09 DIAGNOSIS — M9903 Segmental and somatic dysfunction of lumbar region: Secondary | ICD-10-CM | POA: Diagnosis not present

## 2020-08-10 DIAGNOSIS — M530 Cervicocranial syndrome: Secondary | ICD-10-CM | POA: Diagnosis not present

## 2020-08-10 DIAGNOSIS — M9903 Segmental and somatic dysfunction of lumbar region: Secondary | ICD-10-CM | POA: Diagnosis not present

## 2020-08-10 DIAGNOSIS — M9901 Segmental and somatic dysfunction of cervical region: Secondary | ICD-10-CM | POA: Diagnosis not present

## 2020-08-10 DIAGNOSIS — M9902 Segmental and somatic dysfunction of thoracic region: Secondary | ICD-10-CM | POA: Diagnosis not present

## 2020-08-15 DIAGNOSIS — M9903 Segmental and somatic dysfunction of lumbar region: Secondary | ICD-10-CM | POA: Diagnosis not present

## 2020-08-15 DIAGNOSIS — M9902 Segmental and somatic dysfunction of thoracic region: Secondary | ICD-10-CM | POA: Diagnosis not present

## 2020-08-15 DIAGNOSIS — M530 Cervicocranial syndrome: Secondary | ICD-10-CM | POA: Diagnosis not present

## 2020-08-15 DIAGNOSIS — M9901 Segmental and somatic dysfunction of cervical region: Secondary | ICD-10-CM | POA: Diagnosis not present

## 2020-08-17 DIAGNOSIS — M9902 Segmental and somatic dysfunction of thoracic region: Secondary | ICD-10-CM | POA: Diagnosis not present

## 2020-08-17 DIAGNOSIS — M9903 Segmental and somatic dysfunction of lumbar region: Secondary | ICD-10-CM | POA: Diagnosis not present

## 2020-08-17 DIAGNOSIS — M9901 Segmental and somatic dysfunction of cervical region: Secondary | ICD-10-CM | POA: Diagnosis not present

## 2020-08-17 DIAGNOSIS — M530 Cervicocranial syndrome: Secondary | ICD-10-CM | POA: Diagnosis not present

## 2020-08-24 DIAGNOSIS — M9902 Segmental and somatic dysfunction of thoracic region: Secondary | ICD-10-CM | POA: Diagnosis not present

## 2020-08-24 DIAGNOSIS — M9901 Segmental and somatic dysfunction of cervical region: Secondary | ICD-10-CM | POA: Diagnosis not present

## 2020-08-24 DIAGNOSIS — M530 Cervicocranial syndrome: Secondary | ICD-10-CM | POA: Diagnosis not present

## 2020-08-24 DIAGNOSIS — M9903 Segmental and somatic dysfunction of lumbar region: Secondary | ICD-10-CM | POA: Diagnosis not present

## 2020-08-29 ENCOUNTER — Ambulatory Visit: Payer: BC Managed Care – PPO | Admitting: Family Medicine

## 2020-08-31 ENCOUNTER — Ambulatory Visit: Payer: BC Managed Care – PPO | Admitting: Nurse Practitioner

## 2020-08-31 ENCOUNTER — Other Ambulatory Visit: Payer: Self-pay

## 2020-08-31 ENCOUNTER — Encounter: Payer: Self-pay | Admitting: Nurse Practitioner

## 2020-08-31 VITALS — BP 118/80 | HR 74 | Temp 97.7°F | Ht 64.5 in | Wt 210.0 lb

## 2020-08-31 DIAGNOSIS — M9902 Segmental and somatic dysfunction of thoracic region: Secondary | ICD-10-CM | POA: Diagnosis not present

## 2020-08-31 DIAGNOSIS — M9901 Segmental and somatic dysfunction of cervical region: Secondary | ICD-10-CM | POA: Diagnosis not present

## 2020-08-31 DIAGNOSIS — J300 Vasomotor rhinitis: Secondary | ICD-10-CM | POA: Insufficient documentation

## 2020-08-31 DIAGNOSIS — M9903 Segmental and somatic dysfunction of lumbar region: Secondary | ICD-10-CM | POA: Diagnosis not present

## 2020-08-31 DIAGNOSIS — M530 Cervicocranial syndrome: Secondary | ICD-10-CM | POA: Diagnosis not present

## 2020-08-31 DIAGNOSIS — K5904 Chronic idiopathic constipation: Secondary | ICD-10-CM | POA: Diagnosis not present

## 2020-08-31 NOTE — Progress Notes (Signed)
   Subjective:    Patient ID: Brandi Oconnell, female    DOB: 07/14/1990, 30 y.o.   MRN: [id number]  HPIdiscuss bloodwork results from specialist.     Review of Systems     Objective:   Physical Exam        Assessment & Plan:

## 2020-08-31 NOTE — Progress Notes (Signed)
Subjective:    Patient ID: Brandi Oconnell, female    DOB: 1990/10/29, 30 y.o.   MRN: [id number]  HPI Patient presents with referral from GYN. She sees her GYN regularly and just had an exam in December.  Her creatine and ALT were slightly elevated on her labs in December.    Patient denies any illness at that time, tattoos, or IV drug use.   She drinks lots of water daily.  Denies any rashes.  Denies issues with urination, lower back or flank pain.  Denies abdominal pain.   She takes a probiotic daily to help with chronic constipation.  Without this, she has a large BM about once a week. At the time her labs were drawn she was also taking a prebiotic, which she does occasionally to help with constipation. The constipation concerns her.  She reports having IBS and that constipation runs in her family.  She currently has a bowel movement every 1-2 days but notices it is becoming more like every 2-3 days over the last couple of months.  She has tried numerous medications in the past that do not seem to help.  She does not do anything for the constipation except take the probiotic.  Was prescribed Amitiza by our office but had to stop due to side effects.   She reports chronic postnasal drip.  She has tried numerous medications for this in the past with no relief.  She was referred to an allergist for allergy testing, and also saw an ENT for her GERD symptoms.  GERD symptoms are much better, but postnasal drip persists.  She reports her allergy testing was negative and the allergist prescribed a medication for her, but she can't remember what it was and states it did not help so she stopped taking it. Denies coughing, sneezing, itchy eyes, scratchy throat, headaches, sinus pressure or pain, congestion, or sick contacts.    Review of Systems  Constitutional: Negative for appetite change, fatigue and unexpected weight change.  HENT: Positive for postnasal drip and rhinorrhea. Negative for congestion,  ear pain, sinus pressure, sinus pain and sneezing.   Eyes: Negative for discharge, itching and visual disturbance.  Respiratory: Negative for cough, chest tightness, shortness of breath and wheezing.   Cardiovascular: Negative for chest pain.  Gastrointestinal: Positive for constipation. Negative for abdominal distention, abdominal pain, blood in stool, diarrhea, nausea and vomiting.  Genitourinary: Negative for difficulty urinating.  Neurological: Negative for dizziness, weakness and headaches.  Psychiatric/Behavioral: Negative for sleep disturbance.       Objective:   Physical Exam Vitals and nursing note reviewed. Exam conducted with a chaperone present.  Constitutional:      General: She is not in acute distress. HENT:     Ears:     Comments: TM retracted BIL.  Effusion seen in L ear.  No erythema or irritation seen BIL.     Nose: Rhinorrhea present. No congestion.     Comments: Nasal mucosa:  pale, boggy with clear, watery nasal discharge    Mouth/Throat:     Mouth: Mucous membranes are moist.     Pharynx: Oropharynx is clear.  Eyes:     General:        Right eye: No discharge.        Left eye: No discharge.  Cardiovascular:     Rate and Rhythm: Normal rate and regular rhythm.     Heart sounds: Normal heart sounds.  Pulmonary:     Effort: No respiratory distress.  Breath sounds: Normal breath sounds.  Abdominal:     General: Bowel sounds are normal. There is no distension.     Palpations: Abdomen is soft. There is no mass.     Tenderness: There is no abdominal tenderness. There is no guarding.  Musculoskeletal:     Right lower leg: No edema.     Left lower leg: No edema.  Lymphadenopathy:     Cervical: No cervical adenopathy.  Skin:    General: Skin is warm and dry.  Neurological:     Mental Status: She is alert and oriented to person, place, and time.  Psychiatric:        Mood and Affect: Mood normal.        Behavior: Behavior normal.        Thought Content:  Thought content normal.        Judgment: Judgment normal.    Vitals:   08/31/20 1523  BP: 118/80  Pulse: 74  Temp: 97.7 F (36.5 C)  SpO2: 99%           Assessment & Plan:   Problem List Items Addressed This Visit      Respiratory   Vasomotor rhinitis     Digestive   Chronic idiopathic constipation - Primary      Patient agreeable to try miralax daily (OTC) and see if this will help with constipation.  If this improves, reduce to maintain regular BMs. Will also try to increase her fruit and vegetable intake.    To try OTC nasal steroid spray such as Nasacort or Flonase daily to see if this will help with postnasal drip. Patient understands this may take at least a week or more to see a difference.   Patient to contact the office in a few weeks if constipation or postnasal drip do not improve.   Return if symptoms worsen or fail to improve.

## 2020-08-31 NOTE — Patient Instructions (Addendum)
Try Miralax daily; contact office if constipation does not improve within a few weeks Flonase or Nasacort every day.  Contact office if postnasal drip does not improve within a few weeks   Nonallergic Rhinitis Nonallergic rhinitis is inflammation of the mucous membrane inside the nose. The mucous membrane is the tissue that produces mucus. This condition is different from having allergic rhinitis, which is an allergy that affects the nose. Allergic rhinitis occurs when the body's defense system, or immune system, reacts to a substance that a person is allergic to (allergen), such as pollen, pet dander, mold, or dust. Nonallergic rhinitis has many similar symptoms, but it is not caused by allergens. Nonallergic rhinitis can be an acute or chronic problem. This means it can be short-term or long-term. What are the causes? This condition may be caused by many different things. Some common types of nonallergic rhinitis include:  Infectious rhinitis. This is usually caused by an infection in the nose, throat, or upper airways (upper respiratory system).  Vasomotor rhinitis. This is the most common type of chronic nonallergic rhinitis. It is caused by too much blood flow through your nose, and it leads to swelling in your nose. It is triggered by strong odors, cold air, stress, drinking alcohol, cigarette smoke, or changes in the weather.  Occupational rhinitis. This type is caused by triggers in the workplace, such as chemicals, dust, animal dander, or air pollution.  Hormonal rhinitis, in teenage girls and women. This type is caused by an increase in the hormone estrogen and may happen during pregnancy, puberty, or monthly menstrual periods. Hormonal rhinitis gives you fewer symptoms when estrogen levels drop.  Drug-induced rhinitis. Several types of medicines can cause this, such as medicines for high blood pressure or heart disease, aspirin, or NSAIDs.  Nonallergic rhinitis with eosinophilia  syndrome (NARES). This type is caused by having too much eosinophil, a type of white blood cell. Other causes include a reaction to eating hot or spicy foods. This does not usually cause long-term symptoms. In some cases, the cause of nonallergic rhinitis is not known. What increases the risk? You are more likely to develop this condition if:  You are 23-21 years of age.  You are a woman. Women are twice as likely to have this condition. What are the signs or symptoms? Common symptoms of this condition include:  Stuffy nose (nasal congestion).  Runny nose.  A feeling of mucus dripping down the back of your throat (postnasal drip).  Trouble sleeping.  Tiredness, or fatigue. Other symptoms include:  Sneezing.  Coughing.  Itchy nose.  Bloodshot eyes. How is this diagnosed? This type may be diagnosed based on:  Your symptoms and medical history.  A physical exam.  Allergy testing to rule out allergic rhinitis. You may have skin tests or blood tests. Your health care provider may also take a swab of nasal discharge to look for an increased number of eosinophils. This would be done to confirm a diagnosis of NARES. How is this treated? Treatment for this condition depends on the cause. No single treatment works for everyone. Work with your health care provider to find the best treatment for you. Treatment may include:  Avoiding the things that trigger your symptoms.  Medicines to relieve congestion, such as: ? Steroid nasal spray. There are many types. You may need to try a few to find out which one works best. ? Decongestant medicine. This treats nasal congestion and may be given by mouth or as a nasal  spray. These medicines are used only for a short time.  Medicines to relieve a runny nose. These may include antihistamine medicines or anticholinergic nasal sprays.  Nasal irrigation. This involves using a salt-water (saline) spray or saline container called a neti pot.  Nasal irrigation helps to clear away mucus and keep your nasal passages moist.  Surgery to remove part of your mucous membrane. This is done in severe cases if the condition has not improved after 6-12 months of treatment.   Follow these instructions at home: Medicines  Take or use over-the-counter and prescription medicines only as told by your health care provider. Do not stop using your medicine even if you start to feel better.  Do not take NSAIDs, such as ibuprofen, or medicines that contain aspirin if they make your symptoms worse. Lifestyle  Do not drink alcohol if it makes your symptoms worse.  Do not use any products that contain nicotine or tobacco, such as cigarettes, e-cigarettes, and chewing tobacco. If you need help quitting, ask your health care provider.  Avoid secondhand smoke. General instructions  Avoid triggers that make your symptoms worse.  Use nasal irrigation as told by your health care provider.  Get exercise. Exercise may help reduce symptoms for some people.  Sleep with the head of your bed raised. This may reduce nasal congestion when you sleep.  Drink enough fluid to keep your urine pale yellow.  Keep all follow-up visits as told by your health care provider. This is important. Contact a health care provider if:  You have a fever.  Your symptoms are getting worse at home.  Your symptoms do not lessen with medicine.  You develop new symptoms, especially a headache or nosebleed. Summary  Nonallergic rhinitis is inflammation inside the nose that is not caused by allergens. Nonallergic rhinitis can be a short-term or long-term problem.  Treatment may include avoiding the things that trigger your symptoms.  Take or use over-the-counter and prescription medicines only as told by your health care provider. Do not stop using your medicine even if you start to feel better.  Contact a health care provider if your symptoms do not lessen with  medicine. This information is not intended to replace advice given to you by your health care provider. Make sure you discuss any questions you have with your health care provider. Document Revised: 05/02/2019 Document Reviewed: 05/02/2019 Elsevier Patient Education  2021 [employer].

## 2020-09-01 ENCOUNTER — Encounter: Payer: Self-pay | Admitting: Nurse Practitioner

## 2020-09-06 DIAGNOSIS — M9902 Segmental and somatic dysfunction of thoracic region: Secondary | ICD-10-CM | POA: Diagnosis not present

## 2020-09-06 DIAGNOSIS — M9901 Segmental and somatic dysfunction of cervical region: Secondary | ICD-10-CM | POA: Diagnosis not present

## 2020-09-06 DIAGNOSIS — M530 Cervicocranial syndrome: Secondary | ICD-10-CM | POA: Diagnosis not present

## 2020-09-06 DIAGNOSIS — M9903 Segmental and somatic dysfunction of lumbar region: Secondary | ICD-10-CM | POA: Diagnosis not present

## 2020-09-18 DIAGNOSIS — M9902 Segmental and somatic dysfunction of thoracic region: Secondary | ICD-10-CM | POA: Diagnosis not present

## 2020-09-18 DIAGNOSIS — M9903 Segmental and somatic dysfunction of lumbar region: Secondary | ICD-10-CM | POA: Diagnosis not present

## 2020-09-18 DIAGNOSIS — M9901 Segmental and somatic dysfunction of cervical region: Secondary | ICD-10-CM | POA: Diagnosis not present

## 2020-09-18 DIAGNOSIS — M530 Cervicocranial syndrome: Secondary | ICD-10-CM | POA: Diagnosis not present

## 2020-09-21 ENCOUNTER — Encounter: Payer: Self-pay | Admitting: Nurse Practitioner

## 2020-10-04 DIAGNOSIS — M530 Cervicocranial syndrome: Secondary | ICD-10-CM | POA: Diagnosis not present

## 2020-10-04 DIAGNOSIS — M9902 Segmental and somatic dysfunction of thoracic region: Secondary | ICD-10-CM | POA: Diagnosis not present

## 2020-10-04 DIAGNOSIS — M9903 Segmental and somatic dysfunction of lumbar region: Secondary | ICD-10-CM | POA: Diagnosis not present

## 2020-10-04 DIAGNOSIS — M9901 Segmental and somatic dysfunction of cervical region: Secondary | ICD-10-CM | POA: Diagnosis not present

## 2020-10-08 ENCOUNTER — Encounter: Payer: Self-pay | Admitting: Nurse Practitioner

## 2020-10-09 DIAGNOSIS — M9901 Segmental and somatic dysfunction of cervical region: Secondary | ICD-10-CM | POA: Diagnosis not present

## 2020-10-09 DIAGNOSIS — M9903 Segmental and somatic dysfunction of lumbar region: Secondary | ICD-10-CM | POA: Diagnosis not present

## 2020-10-09 DIAGNOSIS — M9902 Segmental and somatic dysfunction of thoracic region: Secondary | ICD-10-CM | POA: Diagnosis not present

## 2020-10-09 DIAGNOSIS — M530 Cervicocranial syndrome: Secondary | ICD-10-CM | POA: Diagnosis not present

## 2020-10-26 ENCOUNTER — Telehealth (INDEPENDENT_AMBULATORY_CARE_PROVIDER_SITE_OTHER): Payer: BC Managed Care – PPO | Admitting: Nurse Practitioner

## 2020-10-26 ENCOUNTER — Encounter: Payer: Self-pay | Admitting: Nurse Practitioner

## 2020-10-26 ENCOUNTER — Other Ambulatory Visit: Payer: Self-pay

## 2020-10-26 DIAGNOSIS — F339 Major depressive disorder, recurrent, unspecified: Secondary | ICD-10-CM

## 2020-10-26 MED ORDER — BUPROPION HCL ER (XL) 150 MG PO TB24: 150.0000 mg | ORAL_TABLET | Freq: Every day | ORAL | 0 refills | Status: DC

## 2020-10-26 NOTE — Progress Notes (Signed)
Subjective:    Patient ID: Brandi Oconnell, female    DOB: 1991/05/10, 30 y.o.   MRN: [id number]  HPI Patient calls to discuss hormones- SEE PHQ9  Virtual Visit via Telephone Note  I connected with Brandi Oconnell on 10/26/20 at 11:20 AM EDT by telephone and verified that I am speaking with the correct person using two identifiers.  Location: Patient: home Provider: office   I discussed the limitations, risks, security and privacy concerns of performing an evaluation and management service by telephone and the availability of in person appointments. I also discussed with the patient that there may be a patient responsible charge related to this service. The patient expressed understanding and agreed to proceed.   History of Present Illness: See previous MyChart messages.  Patient has had issues with fatigue and decreased libido since the birth of her son in 2019.  Has been on several different oral contraceptives including Seasonique and currently Yaz.  Had Nexplanon at 1 point but had this taken out due to the symptoms.  Was on Seasonique for a long time, again did not have any trouble until after childbirth.  Her symptoms are unchanged despite several different types of hormones.  gets regular follow-up with her gynecologist.  According to patient she has had her thyroid checked twice since her pregnancy and both times this was normal.  Has been working from home long-term due to COVID.  Did see a counselor back in 2020.  Is struggling with some depression.  Denies suicidal or homicidal thoughts or ideation. Depression screen [facility] 2/9 10/26/2020 08/31/2020 10/26/2019 09/01/2018  Decreased Interest 3 0 3 0  Down, Depressed, Hopeless 3 0 3 0  PHQ - 2 Score 6 0 6 0  Altered sleeping 3 - 0 0  Tired, decreased energy 2 - 3 1  Change in appetite 0 - 2 0  Feeling bad or failure about yourself  2 - 0 0  Trouble concentrating 3 - 1 1  Moving slowly or fidgety/restless 2 - 2 0  Suicidal thoughts  0 - 0 0  PHQ-9 Score 18 - 14 2  Difficult doing work/chores Extremely dIfficult - Very difficult Not difficult at all        Observations/Objective: Today's visit was via telephone Physical exam was not possible for this visit Alert, oriented.  Thoughts logical coherent and relevant.  Speech clear.  Assessment and Plan: Problem List Items Addressed This Visit      Other   Depression, recurrent (HCC) - Primary   Relevant Medications   buPROPion (WELLBUTRIN XL) 150 MG 24 hr tablet     Meds ordered this encounter  Medications  . buPROPion (WELLBUTRIN XL) 150 MG 24 hr tablet    Sig: Take 1 tablet (150 mg total) by mouth daily.    Dispense:  30 tablet    Refill:  0    Order Specific Question:   Supervising Provider    Answer:   Lilyan Punt A [[id number]]     Follow Up Instructions: Discussed patient's concerns about low testosterone level.  She had sent her lab work from another provider through [employer], her total testosterone was normal.  Discussed options.  Continue gas as directed at this time.  Recommend bupropion for depression to help with weight libido and energy.  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  Warning signs reviewed about potential adverse effects of bupropion.  Patient to discontinue medication and contact office if any problems.  Otherwise follow-up in 1 month.   I discussed the assessment and treatment plan with the patient. The patient was provided an opportunity to ask questions and all were answered. The patient agreed with the plan and demonstrated an understanding of the instructions.   The patient was advised to call back or seek an in-person evaluation if the symptoms worsen or if the condition fails to improve as anticipated.  I provided 15 minutes of non-face-to-face time during this  encounter.      Review of Systems     Objective:   Physical Exam        Assessment & Plan:

## 2020-10-27 ENCOUNTER — Encounter: Payer: Self-pay | Admitting: Nurse Practitioner

## 2020-10-27 DIAGNOSIS — F32A Depression, unspecified: Secondary | ICD-10-CM | POA: Insufficient documentation

## 2020-10-27 DIAGNOSIS — F339 Major depressive disorder, recurrent, unspecified: Secondary | ICD-10-CM | POA: Insufficient documentation

## 2020-10-30 DIAGNOSIS — R0989 Other specified symptoms and signs involving the circulatory and respiratory systems: Secondary | ICD-10-CM | POA: Diagnosis not present

## 2020-10-30 DIAGNOSIS — J343 Hypertrophy of nasal turbinates: Secondary | ICD-10-CM | POA: Diagnosis not present

## 2020-10-30 DIAGNOSIS — K219 Gastro-esophageal reflux disease without esophagitis: Secondary | ICD-10-CM | POA: Diagnosis not present

## 2020-10-30 DIAGNOSIS — J342 Deviated nasal septum: Secondary | ICD-10-CM | POA: Diagnosis not present

## 2020-11-26 ENCOUNTER — Telehealth: Payer: Self-pay | Admitting: Family Medicine

## 2020-11-26 MED ORDER — BUPROPION HCL ER (XL) 150 MG PO TB24: 150.0000 mg | ORAL_TABLET | Freq: Every day | ORAL | 0 refills | Status: DC

## 2020-11-26 NOTE — Telephone Encounter (Signed)
Pharmacy requesting refill on Bupropion 24 xl tab 150 mg tablet. Take on tablet daily. Pt has upcoming appt with Eber Jones on 12/07/20. Please advise. Thank you

## 2020-11-26 NOTE — Addendum Note (Signed)
Addended by: Annalee Genta on: 11/26/2020 04:13 PM   Modules accepted: Orders

## 2020-11-26 NOTE — Telephone Encounter (Signed)
Left message to return call 

## 2020-11-27 NOTE — Telephone Encounter (Signed)
Pt.notified

## 2020-11-27 NOTE — Telephone Encounter (Signed)
Left message to return call 

## 2020-12-07 ENCOUNTER — Ambulatory Visit: Payer: BC Managed Care – PPO | Admitting: Nurse Practitioner

## 2020-12-07 ENCOUNTER — Other Ambulatory Visit: Payer: Self-pay

## 2020-12-07 ENCOUNTER — Encounter: Payer: Self-pay | Admitting: Nurse Practitioner

## 2020-12-07 VITALS — BP 110/76 | HR 93 | Temp 98.2°F | Wt 209.0 lb

## 2020-12-07 DIAGNOSIS — L301 Dyshidrosis [pompholyx]: Secondary | ICD-10-CM | POA: Diagnosis not present

## 2020-12-07 DIAGNOSIS — F339 Major depressive disorder, recurrent, unspecified: Secondary | ICD-10-CM

## 2020-12-07 DIAGNOSIS — F419 Anxiety disorder, unspecified: Secondary | ICD-10-CM

## 2020-12-07 MED ORDER — BUPROPION HCL ER (XL) 300 MG PO TB24: 300.0000 mg | ORAL_TABLET | Freq: Every day | ORAL | 0 refills | Status: DC

## 2020-12-07 NOTE — Progress Notes (Signed)
   Subjective:    Patient ID: Brandi Oconnell, female    DOB: 1991/02/09, 30 y.o.   MRN: [id number]  HPI Pt here for follow up on Wellbutrin. Pt states it did help with not getting so overwhelmed. Pt taking Wellbutrin daily.  Increased stress the past couple of weeks.  Was seeing some improvement on Wellbutrin initially, would like to try to increase the dose.  Denies suicidal or homicidal thoughts or ideation.  Sleeping much better.   Pt has itchy skin on right palm. Has seen dermatology for it and they prescribed steroid cream. Palm is itchy and peeling with small white bumps at times.  States she has been diagnosed with dyshidrotic eczema.  Unsure about the name of the steroid cream. Depression screen [facility] 2/9 10/26/2020 08/31/2020 10/26/2019 09/01/2018  Decreased Interest 3 0 3 0  Down, Depressed, Hopeless 3 0 3 0  PHQ - 2 Score 6 0 6 0  Altered sleeping 3 - 0 0  Tired, decreased energy 2 - 3 1  Change in appetite 0 - 2 0  Feeling bad or failure about yourself  2 - 0 0  Trouble concentrating 3 - 1 1  Moving slowly or fidgety/restless 2 - 2 0  Suicidal thoughts 0 - 0 0  PHQ-9 Score 18 - 14 2  Difficult doing work/chores Extremely dIfficult - Very difficult Not difficult at all         Objective:   Physical Exam NAD.  Alert, oriented.  Calm more cheerful affect from previous visit.  Making good eye contact.  Dressed appropriately.  Thoughts logical coherent and relevant.  Lungs clear.  Heart regular rate rhythm.  Faint superficial dry skin noted in the palm of the right hand. Today's Vitals   12/07/20 1529  BP: 110/76  Pulse: 93  Temp: 98.2 F (36.8 C)  SpO2: 99%  Weight: 209 lb (94.8 kg)   Body mass index is 35.32 kg/m.        Assessment & Plan:   Problem List Items Addressed This Visit      Musculoskeletal and Integument   Dyshidrotic eczema     Other   Depression, recurrent (HCC) - Primary   Relevant Medications   buPROPion (WELLBUTRIN XL) 300 MG 24 hr tablet      Meds ordered this encounter  Medications  . buPROPion (WELLBUTRIN XL) 300 MG 24 hr tablet    Sig: Take 1 tablet (300 mg total) by mouth daily.    Dispense:  30 tablet    Refill:  0    Order Specific Question:   Supervising Provider    Answer:   Lilyan Punt A [[id number]]  . mometasone (ELOCON) 0.1 % cream    Sig: Apply to affected area once a day prn up to 2 weeks at at time    Dispense:  15 g    Refill:  0    Order Specific Question:   Supervising Provider    Answer:   Lilyan Punt A [[id number]]   Increase Wellbutrin to 300 mg daily.  Cautioned about adverse effects.  If any problems go back to the 150 mg dose and contact the office. Trial of Elocon 0.1% cream as directed to rash on her palm.  Do not use more than 2 weeks at a time.  Call back if persist. Return in about 3 months (around 03/09/2021).

## 2020-12-08 ENCOUNTER — Encounter: Payer: Self-pay | Admitting: Nurse Practitioner

## 2020-12-08 DIAGNOSIS — L301 Dyshidrosis [pompholyx]: Secondary | ICD-10-CM | POA: Insufficient documentation

## 2020-12-08 MED ORDER — MOMETASONE FUROATE 0.1 % EX CREA: TOPICAL_CREAM | CUTANEOUS | 0 refills | Status: DC

## 2020-12-10 DIAGNOSIS — M25551 Pain in right hip: Secondary | ICD-10-CM | POA: Diagnosis not present

## 2020-12-10 DIAGNOSIS — M6281 Muscle weakness (generalized): Secondary | ICD-10-CM | POA: Diagnosis not present

## 2020-12-10 DIAGNOSIS — M62838 Other muscle spasm: Secondary | ICD-10-CM | POA: Diagnosis not present

## 2021-01-08 ENCOUNTER — Other Ambulatory Visit: Payer: Self-pay | Admitting: Nurse Practitioner

## 2021-01-08 DIAGNOSIS — F419 Anxiety disorder, unspecified: Secondary | ICD-10-CM

## 2021-01-08 DIAGNOSIS — F339 Major depressive disorder, recurrent, unspecified: Secondary | ICD-10-CM

## 2021-01-09 MED ORDER — BUPROPION HCL ER (XL) 300 MG PO TB24: 300.0000 mg | ORAL_TABLET | Freq: Every day | ORAL | 0 refills | Status: DC

## 2021-01-22 DIAGNOSIS — J329 Chronic sinusitis, unspecified: Secondary | ICD-10-CM | POA: Diagnosis not present

## 2021-01-22 DIAGNOSIS — J342 Deviated nasal septum: Secondary | ICD-10-CM | POA: Diagnosis not present

## 2021-01-22 DIAGNOSIS — K219 Gastro-esophageal reflux disease without esophagitis: Secondary | ICD-10-CM | POA: Diagnosis not present

## 2021-01-22 DIAGNOSIS — J343 Hypertrophy of nasal turbinates: Secondary | ICD-10-CM | POA: Diagnosis not present

## 2021-01-22 DIAGNOSIS — J341 Cyst and mucocele of nose and nasal sinus: Secondary | ICD-10-CM | POA: Diagnosis not present

## 2021-01-22 DIAGNOSIS — J320 Chronic maxillary sinusitis: Secondary | ICD-10-CM | POA: Diagnosis not present

## 2021-01-31 DIAGNOSIS — D225 Melanocytic nevi of trunk: Secondary | ICD-10-CM | POA: Diagnosis not present

## 2021-01-31 DIAGNOSIS — Z1283 Encounter for screening for malignant neoplasm of skin: Secondary | ICD-10-CM | POA: Diagnosis not present

## 2021-02-18 DIAGNOSIS — L293 Anogenital pruritus, unspecified: Secondary | ICD-10-CM | POA: Diagnosis not present

## 2021-02-18 DIAGNOSIS — N760 Acute vaginitis: Secondary | ICD-10-CM | POA: Diagnosis not present

## 2021-02-28 DIAGNOSIS — J324 Chronic pansinusitis: Secondary | ICD-10-CM | POA: Diagnosis not present

## 2021-02-28 DIAGNOSIS — J338 Other polyp of sinus: Secondary | ICD-10-CM | POA: Diagnosis not present

## 2021-02-28 DIAGNOSIS — J343 Hypertrophy of nasal turbinates: Secondary | ICD-10-CM | POA: Diagnosis not present

## 2021-02-28 DIAGNOSIS — J329 Chronic sinusitis, unspecified: Secondary | ICD-10-CM | POA: Diagnosis not present

## 2021-02-28 DIAGNOSIS — J322 Chronic ethmoidal sinusitis: Secondary | ICD-10-CM | POA: Diagnosis not present

## 2021-02-28 DIAGNOSIS — J320 Chronic maxillary sinusitis: Secondary | ICD-10-CM | POA: Diagnosis not present

## 2021-02-28 DIAGNOSIS — J321 Chronic frontal sinusitis: Secondary | ICD-10-CM | POA: Diagnosis not present

## 2021-03-08 ENCOUNTER — Telehealth: Payer: Self-pay

## 2021-03-08 ENCOUNTER — Telehealth (INDEPENDENT_AMBULATORY_CARE_PROVIDER_SITE_OTHER): Payer: BC Managed Care – PPO | Admitting: Nurse Practitioner

## 2021-03-08 VITALS — Ht 64.5 in | Wt 209.0 lb

## 2021-03-08 DIAGNOSIS — R4589 Other symptoms and signs involving emotional state: Secondary | ICD-10-CM | POA: Diagnosis not present

## 2021-03-08 DIAGNOSIS — F339 Major depressive disorder, recurrent, unspecified: Secondary | ICD-10-CM | POA: Diagnosis not present

## 2021-03-08 MED ORDER — ESCITALOPRAM OXALATE 10 MG PO TABS: 10.0000 mg | ORAL_TABLET | Freq: Every day | ORAL | 0 refills | Status: DC

## 2021-03-08 NOTE — Progress Notes (Deleted)
  I connected with  Jeannine Boga on 03/08/21 by a phone enabled telemedicine application and verified that I am speaking with the correct person using two identifiers.   I discussed the limitations of evaluation and management by telemedicine. The patient expressed understanding and agreed to proceed.  Patient location: home  Provider location: in office  I provided *** minutes of non face - to - face time during this encounter.  Subjective:    Patient ID: KALYSE MEHARG, female    DOB: 11-14-1990, 30 y.o.   MRN: [id number]  HPI Depression follow up  Discuss medication    Review of Systems     Objective:   Physical Exam        Assessment & Plan:

## 2021-03-08 NOTE — Telephone Encounter (Signed)
I connected with  Brandi Oconnell on 03/08/21 by a video enabled telemedicine application and verified that I am speaking with the correct person using two identifiers.   I discussed the limitations of evaluation and management by telemedicine. The patient expressed understanding and agreed to proceed.

## 2021-03-09 ENCOUNTER — Encounter: Payer: Self-pay | Admitting: Nurse Practitioner

## 2021-03-09 DIAGNOSIS — R4589 Other symptoms and signs involving emotional state: Secondary | ICD-10-CM | POA: Insufficient documentation

## 2021-03-09 NOTE — Progress Notes (Signed)
Patient ID: Brandi Oconnell, female   DOB: 1990/09/01, 30 y.o.   MRN: [id number] Virtual Visit via Telephone Note  I connected with Jeannine Boga on 03/09/21 at  3:40 PM EDT by telephone and verified that I am speaking with the correct person using two identifiers.  Location: Patient: home Provider: office   I discussed the limitations, risks, security and privacy concerns of performing an evaluation and management service by telephone and the availability of in person appointments. I also discussed with the patient that there may be a patient responsible charge related to this service. The patient expressed understanding and agreed to proceed.   History of Present Illness: Presents by phone for recheck on her depression.  Continues to struggle with persistent significant flat affect even on bupropion.  With her mother also has mental health issues and is currently on Lexapro which is working well for her.  Patient denies any suicidal or homicidal thoughts or ideation.  Denies any adverse effects from bupropion.  Also her note for work that allows her to work from home will be expiring soon.  Patient continues to have significant difficulty with focusing and socialization especially with the stress in the work environment.  Has been doing well working from home.  Cycles are doing well on Yaz, questions whether some of this could be hormonal.   Observations/Objective: Today's visit was via telephone Physical exam was not possible for this visit Alert, oriented.  Speech clear.  Thoughts logical coherent and relevant.  Assessment and Plan: Problem List Items Addressed This Visit       Other   Depression, recurrent (HCC) - Primary   Relevant Medications   escitalopram (LEXAPRO) 10 MG tablet   Flat affect   Meds ordered this encounter  Medications   escitalopram (LEXAPRO) 10 MG tablet    Sig: Take 1 tablet (10 mg total) by mouth daily.    Dispense:  30 tablet    Refill:  0    Order  Specific Question:   Supervising Provider    Answer:   Lilyan Punt A [[id number]]     Follow Up Instructions: Will add Lexapro 10 mg to her regimen.  At this point continue bupropion at current dose.  Patient to discontinue Lexapro and contact office if any significant adverse effects.  Common side effects were discussed. Patient to check inform progress to complete for her job to allow her to work at home. Encourage patient to discuss I discussed the assessment and treatment plan with the patient. The patient was provided an opportunity to ask questions and all were answered. The patient agreed with the plan and demonstrated an understanding of the instructions. Encourage patient to discuss current oral contraceptive/hormones with her just. The patient was advised to call back or seek an in-person evaluation if the symptoms worsen or if the condition fails to improve as anticipated. Because of upcoming transition in the office, patient to send a MyChart message next week to let us know how Lexapro is doing. I provided 15 minutes of non-face-to-face time during this encounter.   Campbell Riches, NP

## 2021-03-15 ENCOUNTER — Encounter: Payer: Self-pay | Admitting: Nurse Practitioner

## 2021-03-21 ENCOUNTER — Encounter: Payer: Self-pay | Admitting: Nurse Practitioner

## 2021-03-31 ENCOUNTER — Other Ambulatory Visit: Payer: Self-pay | Admitting: Nurse Practitioner

## 2021-03-31 DIAGNOSIS — F419 Anxiety disorder, unspecified: Secondary | ICD-10-CM

## 2021-03-31 DIAGNOSIS — F339 Major depressive disorder, recurrent, unspecified: Secondary | ICD-10-CM

## 2021-04-03 ENCOUNTER — Ambulatory Visit: Payer: Self-pay | Admitting: Physician Assistant

## 2021-04-08 ENCOUNTER — Telehealth: Payer: Self-pay | Admitting: Nurse Practitioner

## 2021-04-08 NOTE — Telephone Encounter (Signed)
Patient is requesting another refill on lexapro 10 mg enough until she is seen by Dr. Adriana Simas 10/21 to get establish last filled 03/08/21

## 2021-04-12 ENCOUNTER — Other Ambulatory Visit: Payer: Self-pay | Admitting: Nurse Practitioner

## 2021-04-12 MED ORDER — ESCITALOPRAM OXALATE 10 MG PO TABS: 10.0000 mg | ORAL_TABLET | Freq: Every day | ORAL | 0 refills | Status: DC

## 2021-04-12 NOTE — Telephone Encounter (Signed)
Done. Refill ordered.

## 2021-04-14 ENCOUNTER — Ambulatory Visit
Admission: EM | Admit: 2021-04-14 | Discharge: 2021-04-14 | Disposition: A | Discharge status: Home / Self Care | Payer: BC Managed Care – PPO | Attending: Family Medicine | Admitting: Family Medicine

## 2021-04-14 ENCOUNTER — Other Ambulatory Visit: Payer: Self-pay

## 2021-04-14 ENCOUNTER — Encounter: Payer: Self-pay | Admitting: Emergency Medicine

## 2021-04-14 DIAGNOSIS — J3089 Other allergic rhinitis: Secondary | ICD-10-CM | POA: Diagnosis not present

## 2021-04-14 DIAGNOSIS — J069 Acute upper respiratory infection, unspecified: Secondary | ICD-10-CM | POA: Insufficient documentation

## 2021-04-14 DIAGNOSIS — J029 Acute pharyngitis, unspecified: Secondary | ICD-10-CM | POA: Insufficient documentation

## 2021-04-14 LAB — POCT RAPID STREP A (OFFICE): Rapid Strep A Screen: NEGATIVE

## 2021-04-14 MED ORDER — PREDNISONE 20 MG PO TABS: 40.0000 mg | ORAL_TABLET | Freq: Every day | ORAL | 0 refills | Status: DC

## 2021-04-14 MED ORDER — ALBUTEROL SULFATE HFA 108 (90 BASE) MCG/ACT IN AERS: 1.0000 | INHALATION_SPRAY | Freq: Four times a day (QID) | RESPIRATORY_TRACT | 0 refills | Status: AC | PRN

## 2021-04-14 MED ORDER — LIDOCAINE VISCOUS HCL 2 % MT SOLN: 10.0000 mL | OROMUCOSAL | 0 refills | Status: DC | PRN

## 2021-04-14 NOTE — ED Provider Notes (Signed)
[facility]    CSN: [id number] Arrival date & time: 04/14/21  0820      History   Chief Complaint Chief Complaint  Patient presents with   Sore Throat    HPI Brandi Oconnell is a 30 y.o. female.   Patient presenting today with 1 week history of sore throat, postnasal drip, runny nose, several days of nausea and vomiting which is now resolved, cough.  States that particular her right tonsil has been increasingly more sore, swollen feeling the past few days.  She initially thought she just was dealing with her typical fall seasonal allergies but her allergy medications have not helped and symptoms continue to worsen.  She denies fever, chills, chest pain, shortness of breath, diarrhea and is tolerating p.o. at this time.  Taking allergy medications, Flonase, Mucinex cold and sinus with minimal relief of symptoms.   Past Medical History:  Diagnosis Date   Allergy    Frequent UTI    GERD (gastroesophageal reflux disease)    IBS (irritable bowel syndrome)    Migraine headache     Patient Active Problem List   Diagnosis Date Noted   Flat affect 03/09/2021   Dyshidrotic eczema 12/08/2020   Depression, recurrent (HCC) 10/27/2020   Vasomotor rhinitis 08/31/2020   Cesarean delivery delivered 03/13/2018   Encounter for induction of labor 03/12/2018   Morbid obesity due to excess calories (HCC) 11/14/2015   Esophageal reflux 08/27/2013   Chronic idiopathic constipation 07/15/2013   Contraception management 04/27/2013   Unspecified gastritis and gastroduodenitis without mention of hemorrhage 12/02/2012    Past Surgical History:  Procedure Laterality Date   CESAREAN SECTION N/A 03/13/2018   Procedure: CESAREAN SECTION with bladder irrigation;  Surgeon: Candice Camp, MD;  Location: [facility];  Service: Obstetrics;  Laterality: N/A;   WISDOM TOOTH EXTRACTION  11/2012    OB History     Gravida  1   Para  1   Term  1   Preterm      AB      Living  1       SAB      IAB      Ectopic      Multiple  0   Live Births  1            Home Medications    Prior to Admission medications   Medication Sig Start Date End Date Taking? Authorizing Provider  albuterol (VENTOLIN HFA) 108 (90 Base) MCG/ACT inhaler Inhale 1-2 puffs into the lungs every 6 (six) hours as needed for wheezing or shortness of breath. 04/14/21  Yes Particia Nearing, PA-C  lidocaine (XYLOCAINE) 2 % solution Use as directed 10 mLs in the mouth or throat as needed for mouth pain. 04/14/21  Yes Particia Nearing, PA-C  predniSONE (DELTASONE) 20 MG tablet Take 2 tablets (40 mg total) by mouth daily with breakfast. 04/14/21  Yes Particia Nearing, PA-C  buPROPion (WELLBUTRIN XL) 300 MG 24 hr tablet TAKE 1 TABLET DAILY 04/01/21   Campbell Riches, NP  drospirenone-ethinyl estradiol (YAZ) 3-0.02 MG tablet Take 1 tablet by mouth daily.    [provider]  escitalopram (LEXAPRO) 10 MG tablet Take 1 tablet (10 mg total) by mouth daily. 04/12/21 04/12/22  Campbell Riches, NP  omeprazole (PRILOSEC) 40 MG capsule Take 40 mg by mouth daily. 08/13/20   [provider]    Family History Family History  Problem Relation Age of Onset  Cancer Mother        melonoma   Cancer Maternal Grandmother        melonoma   Thyroid disease Maternal Grandmother    Rheum arthritis Maternal Grandmother    Heart disease Maternal Grandfather    Hypertension Maternal Grandfather    Hypertension Paternal Grandfather    Hyperlipidemia Paternal Grandfather    Diabetes Paternal Grandfather     Social History Social History   Tobacco Use   Smoking status: Never   Smokeless tobacco: Never  Vaping Use   Vaping Use: Never used  Substance Use Topics   Alcohol use: No   Drug use: No     Allergies   Hydrocodone, Oxycodone, and Penicillins   Review of Systems Review of Systems Per HPI  Physical Exam Triage Vital Signs ED Triage Vitals [04/14/21 0926]   Enc Vitals Group     BP 131/82     Pulse Rate 81     Resp 17     Temp 99.4 F (37.4 C)     Temp Source Oral     SpO2 96 %     Weight 201 lb (91.2 kg)     Height      Head Circumference      Peak Flow      Pain Score 5     Pain Loc      Pain Edu?      Excl. in GC?    No data found.  Updated Vital Signs BP 131/82 (BP Location: Right Arm)   Pulse 81   Temp 99.4 F (37.4 C) (Oral)   Resp 17   Wt 201 lb (91.2 kg)   SpO2 96%   BMI 33.97 kg/m   Visual Acuity Right Eye Distance:   Left Eye Distance:   Bilateral Distance:    Right Eye Near:   Left Eye Near:    Bilateral Near:     Physical Exam Vitals and nursing note reviewed.  Constitutional:      Appearance: Normal appearance. She is not ill-appearing.  HENT:     Head: Atraumatic.     Right Ear: Tympanic membrane normal.     Left Ear: Tympanic membrane normal.     Nose: Rhinorrhea present.     Mouth/Throat:     Mouth: Mucous membranes are moist.     Comments: Very minimal erythema and edema to bilateral tonsils, no exudates, uvula midline, oral airway patent, no abscesses noted on exam. Eyes:     Extraocular Movements: Extraocular movements intact.     Conjunctiva/sclera: Conjunctivae normal.  Cardiovascular:     Rate and Rhythm: Normal rate and regular rhythm.     Heart sounds: Normal heart sounds.  Pulmonary:     Effort: Pulmonary effort is normal. No respiratory distress.     Breath sounds: Normal breath sounds. No wheezing or rales.  Musculoskeletal:        General: Normal range of motion.     Cervical back: Normal range of motion and neck supple.  Skin:    General: Skin is warm and dry.  Neurological:     Mental Status: She is alert and oriented to person, place, and time.     Motor: No weakness.     Gait: Gait normal.  Psychiatric:        Mood and Affect: Mood normal.        Thought Content: Thought content normal.        Judgment: Judgment normal.  UC Treatments / Results  Labs (all  labs ordered are listed, but only abnormal results are displayed) Labs Reviewed  CULTURE, GROUP A STREP [place])  POCT RAPID STREP A (OFFICE)    EKG   Radiology No results found.  Procedures Procedures (including critical care time)  Medications Ordered in UC Medications - No data to display  Initial Impression / Assessment and Plan / UC Course  I have reviewed the triage vital signs and the nursing notes.  Pertinent labs & imaging results that were available during my care of the patient were reviewed by me and considered in my medical decision making (see chart for details).     Suspect viral versus allergic, given duration will avoid viral testing today.  Rapid strep negative, throat culture pending.  We will treat with prednisone as symptoms are likely inflammatory, continue allergy regimen including increasing Flonase to twice a day, viscous lidocaine for sore throat relief.  She is also requesting albuterol inhaler as she sometimes gets into pretty bad coughing fits.  Return for acutely worsening symptoms.  Final Clinical Impressions(s) / UC Diagnoses   Final diagnoses:  Sore throat  Viral URI with cough  Seasonal allergic rhinitis due to other allergic trigger   Discharge Instructions   None    ED Prescriptions     Medication Sig Dispense Auth. Provider   lidocaine (XYLOCAINE) 2 % solution Use as directed 10 mLs in the mouth or throat as needed for mouth pain. 100 mL Particia Nearing, PA-C   albuterol (VENTOLIN HFA) 108 (90 Base) MCG/ACT inhaler Inhale 1-2 puffs into the lungs every 6 (six) hours as needed for wheezing or shortness of breath. 18 g Particia Nearing, [place]   predniSONE (DELTASONE) 20 MG tablet Take 2 tablets (40 mg total) by mouth daily with breakfast. 10 tablet Particia Nearing, [place]      PDMP not reviewed this encounter.   Particia Nearing, [place] 04/14/21 1023

## 2021-04-14 NOTE — ED Triage Notes (Signed)
Swollen tonsils, sore throat, and runny nose for over a week.

## 2021-04-16 MED ORDER — LIDOCAINE VISCOUS HCL 2 % MT SOLN: 15.0000 mL | Freq: Four times a day (QID) | OROMUCOSAL | 0 refills | Status: DC | PRN

## 2021-04-16 NOTE — Telephone Encounter (Signed)
Medication refill has sent.

## 2021-04-17 LAB — CULTURE, GROUP A STREP (THRC)

## 2021-04-22 ENCOUNTER — Ambulatory Visit: Payer: BC Managed Care – PPO

## 2021-04-26 ENCOUNTER — Encounter: Payer: Self-pay | Admitting: Family Medicine

## 2021-04-26 ENCOUNTER — Other Ambulatory Visit: Payer: Self-pay

## 2021-04-26 ENCOUNTER — Ambulatory Visit (INDEPENDENT_AMBULATORY_CARE_PROVIDER_SITE_OTHER): Payer: BC Managed Care – PPO | Admitting: Family Medicine

## 2021-04-26 DIAGNOSIS — F339 Major depressive disorder, recurrent, unspecified: Secondary | ICD-10-CM

## 2021-04-26 NOTE — Patient Instructions (Signed)
Continue the lexapro and wellbutrin.  Be sure to use another contraceptive (ie condoms) if you stop the birth control.  Follow up with Eber Jones as you wish.  Take care  Dr. Adriana Simas

## 2021-04-27 NOTE — Progress Notes (Signed)
Subjective:  Patient ID: Brandi Oconnell, female    DOB: 08/24/1990  Age: 30 y.o. MRN: [id number]  CC: Follow up  HPI:  30 year old female presents for follow up and to establish care with me.  Depression/Mood issues Patient states that she is doing okay. Currently on Wellbutrin and Lexapro. Has not noticed much difference since starting lexapro.  Still has fatigue, flat affect. States it takes big events/issues to cause "big emotions". She has stopped going to [facility] in [place]. She was previously on Testosterone treatment from them. She would like to discuss this today. No other complaints or concerns at this time.   Patient Active Problem List   Diagnosis Date Noted   Flat affect 03/09/2021   Dyshidrotic eczema 12/08/2020   Depression, recurrent (HCC) 10/27/2020   Cesarean delivery delivered 03/13/2018   Morbid obesity due to excess calories (HCC) 11/14/2015   Esophageal reflux 08/27/2013   Chronic idiopathic constipation 07/15/2013   Contraception management 04/27/2013    Social Hx   Social History   Socioeconomic History   Marital status: Married    Spouse name: Not on file   Number of children: Not on file   Years of education: Not on file   Highest education level: Not on file  Occupational History   Not on file  Tobacco Use   Smoking status: Never   Smokeless tobacco: Never  Vaping Use   Vaping Use: Never used  Substance and Sexual Activity   Alcohol use: No   Drug use: No   Sexual activity: Yes    Birth control/protection: Pill  Other Topics Concern   Not on file  Social History Narrative   Not on file   Social Determinants of Health   Financial Resource Strain: Not on file  Food Insecurity: Not on file  Transportation Needs: Not on file  Physical Activity: Not on file  Stress: Not on file  Social Connections: Not on file    Review of Systems  Constitutional: Negative.   Psychiatric/Behavioral:         Mood issues.     Objective:  BP 118/76   Pulse 98   Temp 98.2 F (36.8 C)   Ht 5' 4.5" (1.638 m)   Wt 203 lb 6.4 oz (92.3 kg)   SpO2 99%   BMI 34.37 kg/m   BP/Weight 04/26/2021 04/14/2021 03/08/2021  Systolic BP 118 131 -  Diastolic BP 76 82 -  Wt. (Lbs) 203.4 201 209  BMI 34.37 33.97 35.32    Physical Exam Constitutional:      General: She is not in acute distress.    Appearance: Normal appearance. She is obese.  HENT:     Head: Normocephalic and atraumatic.  Cardiovascular:     Rate and Rhythm: Normal rate and regular rhythm.  Pulmonary:     Effort: Pulmonary effort is normal.     Breath sounds: Normal breath sounds. No wheezing or rales.  Neurological:     Mental Status: She is alert.  Psychiatric:        Behavior: Behavior normal.     Comments: Flat affect but very conversational.     Lab Results  Component Value Date   WBC 17.8 (H) 03/15/2018   HGB 10.5 (L) 03/15/2018   HCT 30.9 (L) 03/15/2018   PLT 267 03/15/2018   GLUCOSE 98 02/01/2018   CHOL 149 12/17/2015   TRIG 63 12/17/2015   HDL 44 12/17/2015   LDLCALC 92 12/17/2015  ALT 14 02/01/2018   AST 18 02/01/2018   NA 137 02/01/2018   K 3.6 02/01/2018   CL 105 02/01/2018   CREATININE 0.75 02/01/2018   BUN 7 02/01/2018   CO2 25 02/01/2018   TSH 2.070 12/17/2015     Assessment & Plan:   Problem List Items Addressed This Visit       Other   Depression, recurrent (HCC)    Continue Wellbutrin and Lexapro at this time. Advised exercise. Can stop OCP if she likes (advised to use back up contraception). Advised against testosterone treatment for low libido. Follow up with Eber Jones.       Follow-up:  Return in the next few months with Eber Jones.  Everlene Other DO [facility]

## 2021-04-27 NOTE — Assessment & Plan Note (Signed)
Continue Wellbutrin and Lexapro at this time. Advised exercise. Can stop OCP if she likes (advised to use back up contraception). Advised against testosterone treatment for low libido. Follow up with Eber Jones.

## 2021-05-09 ENCOUNTER — Encounter: Payer: Self-pay | Admitting: Nurse Practitioner

## 2021-05-20 DIAGNOSIS — M791 Myalgia, unspecified site: Secondary | ICD-10-CM | POA: Diagnosis not present

## 2021-05-20 DIAGNOSIS — Z20822 Contact with and (suspected) exposure to covid-19: Secondary | ICD-10-CM | POA: Diagnosis not present

## 2021-05-20 DIAGNOSIS — J101 Influenza due to other identified influenza virus with other respiratory manifestations: Secondary | ICD-10-CM | POA: Diagnosis not present

## 2021-06-10 ENCOUNTER — Ambulatory Visit: Payer: BC Managed Care – PPO | Admitting: Family Medicine

## 2021-06-11 ENCOUNTER — Other Ambulatory Visit: Payer: Self-pay | Admitting: Family Medicine

## 2021-06-11 DIAGNOSIS — J310 Chronic rhinitis: Secondary | ICD-10-CM | POA: Diagnosis not present

## 2021-06-21 ENCOUNTER — Ambulatory Visit (INDEPENDENT_AMBULATORY_CARE_PROVIDER_SITE_OTHER): Payer: BC Managed Care – PPO | Admitting: Nurse Practitioner

## 2021-06-21 ENCOUNTER — Other Ambulatory Visit: Payer: Self-pay

## 2021-06-21 VITALS — BP 122/77 | HR 70 | Temp 98.1°F | Ht 64.5 in | Wt 210.0 lb

## 2021-06-21 DIAGNOSIS — F419 Anxiety disorder, unspecified: Secondary | ICD-10-CM | POA: Diagnosis not present

## 2021-06-21 DIAGNOSIS — F339 Major depressive disorder, recurrent, unspecified: Secondary | ICD-10-CM | POA: Diagnosis not present

## 2021-06-21 DIAGNOSIS — R4589 Other symptoms and signs involving emotional state: Secondary | ICD-10-CM | POA: Diagnosis not present

## 2021-06-21 MED ORDER — ESCITALOPRAM OXALATE 10 MG PO TABS: 10.0000 mg | ORAL_TABLET | Freq: Every day | ORAL | 1 refills | Status: DC

## 2021-06-21 MED ORDER — BUPROPION HCL ER (XL) 300 MG PO TB24: 300.0000 mg | ORAL_TABLET | Freq: Every day | ORAL | 1 refills | Status: DC

## 2021-06-21 NOTE — Progress Notes (Signed)
° °  Subjective:    Patient ID: Brandi Oconnell, female    DOB: July 11, 1990, 30 y.o.   MRN: [id number]  HPI Presents for follow-up on her anxiety and depression.  Doing much better on Lexapro 10 mg with bupropion XL 300 mg daily.  Has noticed an improvement in her flat affect.  Sleeping much better.  At times still has brief periods of feeling overstimulated or overwhelmed but much improved.  Denies any significant adverse effects with her medications.  Patient decided to stop her Yaz birth control to see if the hormones were adding to her issues.  We will follow-up with gynecology regarding this.  Review of Systems  Psychiatric/Behavioral:  Negative for dysphoric mood, sleep disturbance and suicidal ideas. The patient is nervous/anxious.        Objective:   Physical Exam NAD.  Alert, oriented.  Cheerful calm affect, much improved from previous visits.  Making good eye contact.  Dressed appropriately.  Thoughts logical coherent and relevant.  Speech clear.  Lungs clear.  Heart regular rate and rhythm.  Today's Vitals   06/21/21 1523  BP: 122/77  Pulse: 70  Temp: 98.1 F (36.7 C)  TempSrc: Oral  SpO2: 100%  Weight: 210 lb (95.3 kg)  Height: 5' 4.5" (1.638 m)   Body mass index is 35.49 kg/m.        Assessment & Plan:   Problem List Items Addressed This Visit       Other   Anxiety   Relevant Medications   escitalopram (LEXAPRO) 10 MG tablet   buPROPion (WELLBUTRIN XL) 300 MG 24 hr tablet   Depression, recurrent (HCC) - Primary   Relevant Medications   escitalopram (LEXAPRO) 10 MG tablet   buPROPion (WELLBUTRIN XL) 300 MG 24 hr tablet   Flat affect   Meds ordered this encounter  Medications   escitalopram (LEXAPRO) 10 MG tablet    Sig: Take 1 tablet (10 mg total) by mouth daily.    Dispense:  90 tablet    Refill:  1    Order Specific Question:   Supervising Provider    Answer:   Lilyan Punt A [[id number]]   buPROPion (WELLBUTRIN XL) 300 MG 24 hr tablet    Sig: Take 1  tablet (300 mg total) by mouth daily.    Dispense:  90 tablet    Refill:  1    Order Specific Question:   Supervising Provider    Answer:   Lilyan Punt A [[id number]]   Continue current medications as directed. Return in about 6 months (around 12/20/2021). As long as her symptoms are stable, follow-up in 6 months.  Call back to the office sooner if any problems.

## 2021-06-23 ENCOUNTER — Encounter: Payer: Self-pay | Admitting: Nurse Practitioner

## 2021-06-25 ENCOUNTER — Other Ambulatory Visit: Payer: Self-pay | Admitting: Nurse Practitioner

## 2021-06-25 DIAGNOSIS — F419 Anxiety disorder, unspecified: Secondary | ICD-10-CM

## 2021-06-25 DIAGNOSIS — F339 Major depressive disorder, recurrent, unspecified: Secondary | ICD-10-CM

## 2021-07-09 ENCOUNTER — Other Ambulatory Visit: Payer: Self-pay

## 2021-07-09 ENCOUNTER — Ambulatory Visit (INDEPENDENT_AMBULATORY_CARE_PROVIDER_SITE_OTHER): Payer: BC Managed Care – PPO | Admitting: Family Medicine

## 2021-07-09 ENCOUNTER — Encounter: Payer: Self-pay | Admitting: Family Medicine

## 2021-07-09 VITALS — BP 124/70 | HR 80 | Temp 97.7°F | Ht 65.0 in | Wt 210.2 lb

## 2021-07-09 DIAGNOSIS — Z0001 Encounter for general adult medical examination with abnormal findings: Secondary | ICD-10-CM | POA: Diagnosis not present

## 2021-07-09 DIAGNOSIS — Z13220 Encounter for screening for lipoid disorders: Secondary | ICD-10-CM

## 2021-07-09 DIAGNOSIS — Z130 Encounter for screening for diseases of the blood and blood-forming organs and certain disorders involving the immune mechanism: Secondary | ICD-10-CM | POA: Diagnosis not present

## 2021-07-09 DIAGNOSIS — E669 Obesity, unspecified: Secondary | ICD-10-CM | POA: Diagnosis not present

## 2021-07-09 DIAGNOSIS — Z0000 Encounter for general adult medical examination without abnormal findings: Secondary | ICD-10-CM

## 2021-07-09 NOTE — Patient Instructions (Signed)
Labs at your convenience.  Follow up annually.  Take care  Dr. Merrell Borsuk  

## 2021-07-10 DIAGNOSIS — E669 Obesity, unspecified: Secondary | ICD-10-CM | POA: Diagnosis not present

## 2021-07-10 DIAGNOSIS — Z130 Encounter for screening for diseases of the blood and blood-forming organs and certain disorders involving the immune mechanism: Secondary | ICD-10-CM | POA: Diagnosis not present

## 2021-07-10 DIAGNOSIS — Z0000 Encounter for general adult medical examination without abnormal findings: Secondary | ICD-10-CM | POA: Insufficient documentation

## 2021-07-10 DIAGNOSIS — Z13220 Encounter for screening for lipoid disorders: Secondary | ICD-10-CM | POA: Diagnosis not present

## 2021-07-10 NOTE — Assessment & Plan Note (Signed)
Preventative health care updated today. Screening labs ordered.

## 2021-07-10 NOTE — Progress Notes (Signed)
Subjective:  Patient ID: Brandi Oconnell, female    DOB: 08/29/1990  Age: 31 y.o. MRN: [id number]  CC: Annual exam   HPI Brandi Oconnell is a 31 y.o. female presents to the clinic today for an annual physical exam. Needs labs and form filled out for work. Has no complaints or concerns at this time.  Preventative Healthcare Pap smear: Up to date. Follows with [facility]. Mammogram: Not yet indicated. Colonoscopy: Not yet indicated. Immunizations Tetanus - Up to date. Pneumococcal - Not recommended currently. Flu - Declines. Recently had influenza.  Hepatitis C screening - Candidate for. Labs: Needs screening labs. Alcohol use: No. Smoking/tobacco use: No.  PMH, Surgical Hx, Family Hx, Social History reviewed and updated as below.  Past Medical History:  Diagnosis Date   Allergy    Frequent UTI    GERD (gastroesophageal reflux disease)    IBS (irritable bowel syndrome)    Migraine headache    Past Surgical History:  Procedure Laterality Date   CESAREAN SECTION N/A 03/13/2018   Procedure: CESAREAN SECTION with bladder irrigation;  Surgeon: Louretta Shorten, MD;  Location: [place];  Service: Obstetrics;  Laterality: N/A;   WISDOM TOOTH EXTRACTION  11/2012   Family History  Problem Relation Age of Onset   Cancer Mother        melonoma   Cancer Maternal Grandmother        melonoma   Thyroid disease Maternal Grandmother    Rheum arthritis Maternal Grandmother    Heart disease Maternal Grandfather    Hypertension Maternal Grandfather    Hypertension Paternal Grandfather    Hyperlipidemia Paternal Grandfather    Diabetes Paternal Grandfather    Social History   Tobacco Use   Smoking status: Never   Smokeless tobacco: Never  Substance Use Topics   Alcohol use: No    Review of Systems  Constitutional: Negative.   Respiratory: Negative.    Cardiovascular: Negative.    Objective:   Today's Vitals: BP 124/70    Pulse 80    Temp 97.7 F (36.5 C)     Ht '5\' 5"'  (1.651 m)    Wt 210 lb 3.2 oz (95.3 kg)    SpO2 99%    BMI 34.98 kg/m   Physical Exam Constitutional:      General: She is not in acute distress.    Appearance: Normal appearance. She is obese. She is not ill-appearing.  HENT:     Head: Normocephalic and atraumatic.     Nose: Nose normal.  Eyes:     General:        Right eye: No discharge.        Left eye: No discharge.     Conjunctiva/sclera: Conjunctivae normal.  Cardiovascular:     Rate and Rhythm: Normal rate and regular rhythm.     Heart sounds: No murmur heard. Pulmonary:     Effort: Pulmonary effort is normal.     Breath sounds: Normal breath sounds. No wheezing, rhonchi or rales.  Abdominal:     General: There is no distension.     Palpations: Abdomen is soft.  Skin:    General: Skin is warm.     Findings: No rash.  Neurological:     Mental Status: She is alert.  Psychiatric:        Mood and Affect: Mood normal.        Behavior: Behavior normal.     Assessment & Plan:   Problem List Items  Addressed This Visit       Other   Annual physical exam - Primary    Preventative health care updated today. Screening labs ordered.      Other Visit Diagnoses     Screening for deficiency anemia       Relevant Orders   CBC   Obesity (BMI 30.0-34.9)       Relevant Orders   CMP14+EGFR   Hemoglobin A1c   Screening, lipid       Relevant Orders   Lipid panel      Follow-up: Annually.  [place]

## 2021-07-11 LAB — LIPID PANEL
Chol/HDL Ratio: 2.9 ratio (ref 0.0–4.4)
Cholesterol, Total: 167 mg/dL (ref 100–199)
HDL: 57 mg/dL (ref >39)
LDL Chol Calc (NIH): 93 mg/dL (ref 0–99)
Triglycerides: 94 mg/dL (ref 0–149)
VLDL Cholesterol Cal: 17 mg/dL (ref 5–40)

## 2021-07-11 LAB — CMP14+EGFR
ALT: 24 IU/L (ref 0–32)
AST: 19 IU/L (ref 0–40)
Albumin/Globulin Ratio: 2.1 (ref 1.2–2.2)
Albumin: 4.8 g/dL (ref 3.9–5.0)
Alkaline Phosphatase: 89 IU/L (ref 44–121)
BUN/Creatinine Ratio: 15 (ref 9–23)
BUN: 14 mg/dL (ref 6–20)
Bilirubin Total: 0.3 mg/dL (ref 0.0–1.2)
CO2: 24 mmol/L (ref 20–29)
Calcium: 9.5 mg/dL (ref 8.7–10.2)
Chloride: 100 mmol/L (ref 96–106)
Creatinine, Ser: 0.95 mg/dL (ref 0.57–1.00)
Globulin, Total: 2.3 g/dL (ref 1.5–4.5)
Glucose: 90 mg/dL (ref 70–99)
Potassium: 4.1 mmol/L (ref 3.5–5.2)
Sodium: 136 mmol/L (ref 134–144)
Total Protein: 7.1 g/dL (ref 6.0–8.5)
eGFR: 84 mL/min/{1.73_m2} (ref >59)

## 2021-07-11 LAB — CBC
Hematocrit: 43 % (ref 34.0–46.6)
Hemoglobin: 14.5 g/dL (ref 11.1–15.9)
MCH: 28.6 pg (ref 26.6–33.0)
MCHC: 33.7 g/dL (ref 31.5–35.7)
MCV: 85 fL (ref 79–97)
Platelets: 338 10*3/uL (ref 150–450)
RBC: 5.07 x10E6/uL (ref 3.77–5.28)
RDW: 11.8 % (ref 11.7–15.4)
WBC: 9.8 10*3/uL (ref 3.4–10.8)

## 2021-07-11 LAB — HEMOGLOBIN A1C
Est. average glucose Bld gHb Est-mCnc: 111 mg/dL
Hgb A1c MFr Bld: 5.5 % (ref 4.8–5.6)

## 2021-07-19 ENCOUNTER — Telehealth: Payer: Self-pay | Admitting: Family Medicine

## 2021-07-19 NOTE — Telephone Encounter (Signed)
error 

## 2021-07-23 ENCOUNTER — Telehealth: Payer: Self-pay | Admitting: Nurse Practitioner

## 2021-07-23 NOTE — Telephone Encounter (Signed)
[facility] called  requesting refill for patient's Bupropion 300 mg 24 90 day supply to be called in [phone] [phone]. She got 30 on 06/21/21 sent to [facility]. Please advise

## 2021-07-24 ENCOUNTER — Other Ambulatory Visit: Payer: Self-pay | Admitting: Nurse Practitioner

## 2021-07-24 DIAGNOSIS — F419 Anxiety disorder, unspecified: Secondary | ICD-10-CM

## 2021-07-24 DIAGNOSIS — F339 Major depressive disorder, recurrent, unspecified: Secondary | ICD-10-CM

## 2021-07-24 MED ORDER — BUPROPION HCL ER (XL) 300 MG PO TB24: 300.0000 mg | ORAL_TABLET | Freq: Every day | ORAL | 1 refills | Status: DC

## 2021-07-24 NOTE — Telephone Encounter (Signed)
Patient notified

## 2021-07-24 NOTE — Telephone Encounter (Signed)
Left message to return call 

## 2021-07-24 NOTE — Telephone Encounter (Signed)
Campbell Riches, NP    Sent order in electronically to mail pharmacy

## 2021-07-29 ENCOUNTER — Other Ambulatory Visit: Payer: Self-pay | Admitting: Nurse Practitioner

## 2021-08-14 DIAGNOSIS — J039 Acute tonsillitis, unspecified: Secondary | ICD-10-CM | POA: Diagnosis not present

## 2021-09-09 ENCOUNTER — Encounter: Payer: Self-pay | Admitting: Nurse Practitioner

## 2021-09-13 NOTE — Telephone Encounter (Signed)
Completed form and faxed to fax numbers provided by patient , patient picked copy up and sent copy to scan .  ?

## 2021-10-14 ENCOUNTER — Other Ambulatory Visit: Payer: Self-pay | Admitting: Family Medicine

## 2021-10-14 ENCOUNTER — Telehealth: Payer: Self-pay | Admitting: *Deleted

## 2021-10-14 NOTE — Telephone Encounter (Signed)
Brandi Oconnell from [employer] [place] called and stated that the forms that were completed for renewal/extension were not sufficient to certify patient to continue to work from home for another 6 months. ? ?Brandi Oconnell stated the forms must state specifically and in great detail exactly what specific things in the office setting would specifically continue to exacerbate her specific condition and what specifically at home alleviates the specific condition that can not be reasonably accommodated in the office setting. ? ? ?Also needs to include is there a specific point at which the patient would be able to return to office setting ? ? ?Brandi Oconnell [phone] ? ? ? ? ?

## 2021-10-21 NOTE — Telephone Encounter (Signed)
Left message to return call 

## 2021-10-21 NOTE — Telephone Encounter (Signed)
Patient scheduled office visit to discuss 10/25/21 at 3:40 pm with Eber Jones NP ?

## 2021-10-21 NOTE — Telephone Encounter (Signed)
Nilda Simmer, NP   ? ?If we need that much detail, she will need to make an office visit to get the information that we need if we wishes to continue working from home. Thanks.   ? ?

## 2021-10-25 ENCOUNTER — Ambulatory Visit: Payer: BC Managed Care – PPO | Admitting: Nurse Practitioner

## 2021-10-25 ENCOUNTER — Encounter: Payer: Self-pay | Admitting: Nurse Practitioner

## 2021-10-25 VITALS — BP 111/76 | HR 76 | Temp 98.7°F | Ht 65.0 in | Wt 216.0 lb

## 2021-10-25 DIAGNOSIS — F339 Major depressive disorder, recurrent, unspecified: Secondary | ICD-10-CM

## 2021-10-25 DIAGNOSIS — F419 Anxiety disorder, unspecified: Secondary | ICD-10-CM | POA: Diagnosis not present

## 2021-10-25 MED ORDER — ESCITALOPRAM OXALATE 20 MG PO TABS: 20.0000 mg | ORAL_TABLET | Freq: Every day | ORAL | 0 refills | Status: DC

## 2021-10-25 NOTE — Progress Notes (Signed)
? ?  Subjective:  ? ? Patient ID: Brandi Oconnell, female    DOB: 1990/11/14, 31 y.o.   MRN: [id number] ? ?HPI ? ?Form completion for working from home  ?Received note from her employer that they will need more details regarding her request to work from home. ?Currently on Lexapro 10 mg daily.  Also Wellbutrin XL 300 mg daily.  Was doing well overall until recently, has noticed more agitation and emotional lability.  Distracted easily.  Work performance is improved in a quiet environment with less noise and people, feels less overwhelmed and anxious.  Sleep has improved on Lexapro. ? ? ?  10/25/2021  ?  4:06 PM 03/08/2021  ?  3:37 PM 10/26/2019  ? 11:12 AM  ?GAD 7 : Generalized Anxiety Score  ?Nervous, Anxious, on Edge 2 1 3   ?Control/stop worrying 2 1 0  ?Worry too much - different things 2 1 2   ?Trouble relaxing 1 2 2   ?Restless 1 2 2   ?Easily annoyed or irritable 3 1 3   ?Afraid - awful might happen 0 0 1  ?Total GAD 7 Score 11 8 13   ?Anxiety Difficulty Somewhat difficult Very difficult Very difficult  ? ? ? ? ?   ?Objective:  ? Physical Exam ?NAD.  Alert, oriented.  Mildly anxious affect.  Making good eye contact.  Dressed appropriately.  Speech clear.  Thoughts logical coherent and relevant.  Lungs clear.  Heart regular rate rhythm. ?Today's Vitals  ? 10/25/21 1554  ?BP: 111/76  ?Pulse: 76  ?Temp: 98.7 ?F (37.1 ?C)  ?SpO2: 99%  ?Weight: 216 lb (98 kg)  ?Height: 5\' 5"  (1.651 m)  ? ?Body mass index is 35.94 kg/m?. ? ? ? ?   ?Assessment & Plan:  ? ?Problem List Items Addressed This Visit   ? ?  ? Other  ? Anxiety - Primary  ? Relevant Medications  ? escitalopram (LEXAPRO) 20 MG tablet  ? Depression, recurrent (HCC)  ? Relevant Medications  ? escitalopram (LEXAPRO) 20 MG tablet  ? ?Meds ordered this encounter  ?Medications  ? escitalopram (LEXAPRO) 20 MG tablet  ?  Sig: Take 1 tablet (20 mg total) by mouth daily.  ?  Dispense:  90 tablet  ?  Refill:  0  ?  Order Specific Question:   Supervising Provider  ?  Answer:    A [[id number]]  ? ?Increase escitalopram to 20 mg daily.  Go back to previous 10 mg dose if any problems and contact the office.  Continue Wellbutrin as directed.  Forms filled out in the office for her employer for request to work from home.  These will be faxed as soon as we get the correct number. ?Patient given information on genesight genetic testing for medications for mental illness for her to review since she has had difficulty finding the right type of medicine.  She will check with insurance company and let know if she wishes to proceed. ?Return in about 3 months (around 01/24/2022). ? ? ?

## 2021-10-25 NOTE — Patient Instructions (Signed)
Increase Lexapro to 20 mg daily.

## 2021-11-14 ENCOUNTER — Telehealth: Payer: Self-pay | Admitting: Nurse Practitioner

## 2021-11-14 NOTE — Telephone Encounter (Signed)
Patient's job has faxed over another form to be completed in your office in the blue folder. ?

## 2021-11-15 NOTE — Telephone Encounter (Signed)
Done and given to front desk to fax over.  ?

## 2021-12-22 ENCOUNTER — Other Ambulatory Visit: Payer: Self-pay | Admitting: Nurse Practitioner

## 2021-12-22 DIAGNOSIS — F339 Major depressive disorder, recurrent, unspecified: Secondary | ICD-10-CM

## 2021-12-22 DIAGNOSIS — F419 Anxiety disorder, unspecified: Secondary | ICD-10-CM

## 2022-01-05 ENCOUNTER — Other Ambulatory Visit: Payer: Self-pay | Admitting: Family Medicine

## 2022-01-17 ENCOUNTER — Ambulatory Visit: Payer: Self-pay | Admitting: Nurse Practitioner

## 2022-01-21 ENCOUNTER — Encounter: Payer: Self-pay | Admitting: Family Medicine

## 2022-01-24 ENCOUNTER — Ambulatory Visit: Payer: BC Managed Care – PPO | Admitting: Family Medicine

## 2022-01-24 ENCOUNTER — Encounter: Payer: Self-pay | Admitting: Family Medicine

## 2022-01-24 DIAGNOSIS — E6609 Other obesity due to excess calories: Secondary | ICD-10-CM | POA: Diagnosis not present

## 2022-01-24 DIAGNOSIS — Z6836 Body mass index (BMI) 36.0-36.9, adult: Secondary | ICD-10-CM | POA: Diagnosis not present

## 2022-01-24 MED ORDER — SEMAGLUTIDE-WEIGHT MANAGEMENT 0.25 MG/0.5ML ~~LOC~~ SOAJ: 0.2500 mg | SUBCUTANEOUS | 0 refills | Status: DC

## 2022-01-24 NOTE — Patient Instructions (Signed)
Let me know how things go over the next month.  Take care  Dr. Adriana Simas

## 2022-01-27 ENCOUNTER — Telehealth: Payer: Self-pay | Admitting: Family Medicine

## 2022-01-27 DIAGNOSIS — E669 Obesity, unspecified: Secondary | ICD-10-CM | POA: Insufficient documentation

## 2022-01-27 NOTE — Telephone Encounter (Signed)
Mychart message sent to patient.

## 2022-01-27 NOTE — Progress Notes (Signed)
Subjective:  Patient ID: Brandi Oconnell, female    DOB: [birth year]  Age: 31 y.o. MRN: [id number]  CC: Chief Complaint  Patient presents with   Weight Concerns    Pt having concerns of not being able to lose weight. Pt states son is almost 4 and she has not been able to lose the baby weight.     HPI:  31 year old female presents for evaluation of the above.  Patient states that she is having difficulty losing weight.  She is trying to stay active and watch her diet but continues to have difficulty.  I suspect most of her troubles are related to her diet.  Patient would like to discuss treatment options today, including weight loss medications.  Patient Active Problem List   Diagnosis Date Noted   Obesity 01/27/2022   Annual physical exam 07/10/2021   Anxiety 06/21/2021   Flat affect 03/09/2021   Dyshidrotic eczema 12/08/2020   Depression, recurrent (HCC) 10/27/2020   Chronic idiopathic constipation 07/15/2013    Social Hx   Social History   Socioeconomic History   Marital status: Married    Spouse name: Not on file   Number of children: Not on file   Years of education: Not on file   Highest education level: Not on file  Occupational History   Not on file  Tobacco Use   Smoking status: Never   Smokeless tobacco: Never  Vaping Use   Vaping Use: Never used  Substance and Sexual Activity   Alcohol use: No   Drug use: No   Sexual activity: Yes    Birth control/protection: Pill  Other Topics Concern   Not on file  Social History Narrative   Not on file   Social Determinants of Health   Financial Resource Strain: Low Risk  (03/10/2018)   Overall Financial Resource Strain (CARDIA)    Difficulty of Paying Living Expenses: Not hard at all  Food Insecurity: No Food Insecurity (03/10/2018)   Hunger Vital Sign    Worried About Running Out of Food in the Last Year: Never true    Ran Out of Food in the Last Year: Never true  Transportation Needs: Unknown (03/10/2018)    PRAPARE - Administrator, Civil Service (Medical): No    Lack of Transportation (Non-Medical): Not on file  Physical Activity: Inactive (03/10/2018)   Exercise Vital Sign    Days of Exercise per Week: 0 days    Minutes of Exercise per Session: 0 min  Stress: No Stress Concern Present (03/10/2018)   [employer] of Occupational Health - Occupational Stress Questionnaire    Feeling of Stress : Not at all  Social Connections: Not on file    Review of Systems  Constitutional:        Difficulty losing weight.  Respiratory: Negative.    Cardiovascular: Negative.    Objective:  BP 120/80   Pulse 80   Temp 97.7 F (36.5 C)   Ht 5\' 5"  (1.651 m)   Wt 222 lb (100.7 kg)   SpO2 98%   BMI 36.94 kg/m      01/24/2022    2:03 PM 10/25/2021    3:54 PM 07/09/2021    1:37 PM  BP/Weight  Systolic BP 120 111 124  Diastolic BP 80 76 70  Wt. (Lbs) 222 216 210.2  BMI 36.94 kg/m2 35.94 kg/m2 34.98 kg/m2    Physical Exam Vitals and nursing note reviewed.  Constitutional:      Appearance:  Normal appearance. She is obese.  Eyes:     General:        Right eye: No discharge.        Left eye: No discharge.     Conjunctiva/sclera: Conjunctivae normal.  Pulmonary:     Effort: Pulmonary effort is normal. No respiratory distress.  Neurological:     Mental Status: She is alert.  Psychiatric:        Mood and Affect: Mood normal.        Behavior: Behavior normal.     Lab Results  Component Value Date   WBC 9.8 07/10/2021   HGB 14.5 07/10/2021   HCT 43.0 07/10/2021   PLT 338 07/10/2021   GLUCOSE 90 07/10/2021   CHOL 167 07/10/2021   TRIG 94 07/10/2021   HDL 57 07/10/2021   LDLCALC 93 07/10/2021   ALT 24 07/10/2021   AST 19 07/10/2021   NA 136 07/10/2021   K 4.1 07/10/2021   CL 100 07/10/2021   CREATININE 0.95 07/10/2021   BUN 14 07/10/2021   CO2 24 07/10/2021   TSH 2.070 12/17/2015   HGBA1C 5.5 07/10/2021     Assessment & Plan:   Problem List Items Addressed  This Visit       Other   Obesity    We discussed nutrition today.  Need for caloric deficit. Recommended regular physical activity. Offered referral to nutrition as well as healthy weight and wellness.  Patient would like to wait. Patient is interested in [place].  I have sent this in.  We will see if we can get this approved by her insurance and also see if it is available locally.      Relevant Medications   Semaglutide-Weight Management 0.25 MG/0.5ML SOAJ    Meds ordered this encounter  Medications   Semaglutide-Weight Management 0.25 MG/0.5ML SOAJ    Sig: Inject 0.25 mg into the skin once a week.    Dispense:  2 mL    Refill:  0    Cordera Stineman DO [facility]

## 2022-01-27 NOTE — Assessment & Plan Note (Signed)
We discussed nutrition today.  Need for caloric deficit. Recommended regular physical activity. Offered referral to nutrition as well as healthy weight and wellness.  Patient would like to wait. Patient is interested in [place].  I have sent this in.  We will see if we can get this approved by her insurance and also see if it is available locally.

## 2022-01-27 NOTE — Telephone Encounter (Signed)
Pt Wegovy denied due to not meeting requirements of her plan. Plan will cover once pt has participated in a comprehensive weight management program 6 months prior to using drug therapy.

## 2022-01-27 NOTE — Telephone Encounter (Signed)
Per Epic pt read my chart message  

## 2022-02-20 ENCOUNTER — Other Ambulatory Visit: Payer: Self-pay | Admitting: *Deleted

## 2022-02-20 NOTE — Patient Outreach (Signed)
  Care Coordination   02/20/2022  Name: Brandi Oconnell MRN: [id number] DOB: [birth year]   Care Coordination Outreach Attempts:  An unsuccessful telephone outreach was attempted today to offer the patient information about available care coordination services as a benefit of their health plan. HIPAA compliant message left on voicemail, providing contact information for CSW, encouraging patient to return CSW's call at her earliest convenience.  Follow Up Plan:  Additional outreach attempts will be made to offer the patient care coordination information and services.   Encounter Outcome:  No Answer.   Care Coordination Interventions Activated:  No.    Care Coordination Interventions:  No, not indicated.    Danford Bad, BSW, MSW, LCSW  Licensed Restaurant manager, [employer] food [facility]  Mailing [place] [address], [place], [place] [address] Physical Address[address] [place], [place] [address] Toll Free Main # [phone] Fax # [phone] Cell # [phone] [name]

## 2022-03-05 ENCOUNTER — Other Ambulatory Visit: Payer: Self-pay | Admitting: *Deleted

## 2022-03-05 NOTE — Patient Outreach (Signed)
  Care Coordination   03/05/2022  Name: Brandi Oconnell MRN: [id number] DOB: 1991-06-17   Care Coordination Outreach Attempts:  A second unsuccessful outreach was attempted today to offer the patient with information about available care coordination services as a benefit of their health plan.   HIPAA compliant message left on voicemail, providing contact information for CSW, encouraging patient to return CSW's call at her earliest convenience.  Follow Up Plan:  Additional outreach attempts will be made to offer the patient care coordination information and services.   Encounter Outcome:  No Answer.   Care Coordination Interventions Activated:  No.    Care Coordination Interventions:  No, not indicated.    Danford Bad, BSW, MSW, LCSW  Licensed Restaurant manager, fast food [facility] System  Mailing [place] [address] [place], [place] [address] Physical Address[address], [place], [place] [address] Toll Free Main # [phone] Fax # [phone] Cell # [phone] [email or web address]

## 2022-03-21 ENCOUNTER — Other Ambulatory Visit: Payer: Self-pay | Admitting: *Deleted

## 2022-03-21 NOTE — Patient Outreach (Signed)
  Care Coordination   03/21/2022  Name: Brandi Oconnell MRN: [id number] DOB: August 31, 1990   Care Coordination Outreach Attempts:  A third unsuccessful outreach was attempted today to offer the patient with information about available care coordination services as a benefit of their health plan. HIPAA compliant message left on voicemail, providing contact information for CSW, encouraging patient to return CSW's call at her earliest convenience.   Follow Up Plan:  No further outreach attempts will be made at this time. We have been unable to contact the patient to offer or enroll patient in care coordination services.   Encounter Outcome:  No Answer.    Care Coordination Interventions Activated:  No.     Care Coordination Interventions:  No, not indicated.     Danford Bad, BSW, MSW, LCSW  Licensed Restaurant manager, fast food Health System  Mailing [place] [address] [place], [place] [address] Physical Address[address], [place], [place] [address] Toll Free Main # [phone] Fax # [phone] Cell # [phone] [email or web address]

## 2022-03-24 ENCOUNTER — Encounter: Payer: Self-pay | Admitting: Nurse Practitioner

## 2022-04-02 ENCOUNTER — Ambulatory Visit: Payer: BC Managed Care – PPO | Admitting: Nurse Practitioner

## 2022-04-02 ENCOUNTER — Encounter: Payer: Self-pay | Admitting: Nurse Practitioner

## 2022-04-02 VITALS — BP 116/78 | HR 85 | Temp 98.1°F | Ht 65.0 in | Wt 220.0 lb

## 2022-04-02 DIAGNOSIS — R151 Fecal smearing: Secondary | ICD-10-CM | POA: Diagnosis not present

## 2022-04-02 DIAGNOSIS — N839 Noninflammatory disorder of ovary, fallopian tube and broad ligament, unspecified: Secondary | ICD-10-CM | POA: Diagnosis not present

## 2022-04-02 DIAGNOSIS — K644 Residual hemorrhoidal skin tags: Secondary | ICD-10-CM | POA: Diagnosis not present

## 2022-04-02 MED ORDER — ONDANSETRON 4 MG PO TBDP: 4.0000 mg | ORAL_TABLET | Freq: Three times a day (TID) | ORAL | 0 refills | Status: AC | PRN

## 2022-04-02 NOTE — Progress Notes (Signed)
Subjective:    Patient ID: Brandi Oconnell, female    DOB: February 01, 1991, 31 y.o.   MRN: [id number]  HPI  31 year old female patient with history of irritable bowel syndrome, migraine headache, and chronic idiopathic constipation presents to clinic today with complaints of possible hemorrhoids x 1 yr after having her child and anal seepage.  Patient states that she has been seen by a pelvic floor specialist to help with anal for seepage that has not been helpful.  Patient states that current stools are normal.  Patient describes them as a type IV on the [facility] stool chart.  Patient states when she is having a bowel movement she feels like something is coming out of her anus and that area will also get while with wiping.  Patient also admits to itching around the time of her bowel movements.  Patient denies any blood, mucus, pain to her rectum.  Patient describes anal seepage as fecal smearing on her underwear after bowel movements.   Patient also has concerns about feeling nauseous around her ovulation.  Patient states that nausea tends to happen about 48 hours during her ovulation.  Patient denies any PMS symptoms.  Patient denies any vomiting.  Patient was previously on [place] and [place]. Patient states that she has been off of birth control for a year.  Patient has no other concerns today  Review of Systems  Gastrointestinal:        Anal seepage  All other systems reviewed and are negative.      Objective:   Physical Exam Vitals reviewed. Exam conducted with a chaperone present.  Constitutional:      General: She is not in acute distress.    Appearance: Normal appearance. She is obese. She is not ill-appearing, toxic-appearing or diaphoretic.  HENT:     Head: Normocephalic and atraumatic.  Genitourinary:    Rectum: External hemorrhoid present. No tenderness or anal fissure.     Comments: Fairly large external hemorrhage noted to anus around 12:00 area that resembles a large  skin tag.  Area not engorged bleeding or discharge.  No abnormalities noted to anal sphincter at time of exam.  No fissures noted. Neurological:     Mental Status: She is alert.  Psychiatric:        Mood and Affect: Mood normal.        Behavior: Behavior normal.           Assessment & Plan:   1. Hemorrhoids, external -We will refer patient to gastroenterology for evaluation of hemorrhoids. -Patient may use over-the-counter hemorrhoid cream for symptoms - Ambulatory referral to Gastroenterology -Return to clinic if symptoms worsen or do not improve  2. Fecal smearing -We will refer to gastroenterology for evaluation of fecal smearing - Ambulatory referral to Gastroenterology -Return to clinic if symptoms worsen or do not improve  3. Problems with ovulation -Patient may use Zofran as needed for occasional nausea related to ovulation. - ondansetron (ZOFRAN-ODT) 4 MG disintegrating tablet; Take 1 tablet (4 mg total) by mouth every 8 (eight) hours as needed for nausea or vomiting.  Dispense: 20 tablet; Refill: 0 -Return to clinic as needed    Note:  This document was prepared using Dragon voice recognition software and may include unintentional dictation errors. Note - This record has been created using [employer].  Chart creation errors have been sought, but may not always  have been located. Such creation errors do not reflect on  the standard of medical care.

## 2022-04-05 ENCOUNTER — Other Ambulatory Visit: Payer: Self-pay | Admitting: Nurse Practitioner

## 2022-04-05 ENCOUNTER — Encounter: Payer: Self-pay | Admitting: Nurse Practitioner

## 2022-04-05 DIAGNOSIS — F339 Major depressive disorder, recurrent, unspecified: Secondary | ICD-10-CM

## 2022-04-05 DIAGNOSIS — F419 Anxiety disorder, unspecified: Secondary | ICD-10-CM

## 2022-04-05 DIAGNOSIS — R4589 Other symptoms and signs involving emotional state: Secondary | ICD-10-CM

## 2022-04-11 ENCOUNTER — Other Ambulatory Visit: Payer: Self-pay | Admitting: Family Medicine

## 2022-04-11 MED ORDER — ESCITALOPRAM OXALATE 20 MG PO TABS: 20.0000 mg | ORAL_TABLET | Freq: Every day | ORAL | 3 refills | Status: DC

## 2022-04-18 ENCOUNTER — Ambulatory Visit: Payer: BC Managed Care – PPO | Admitting: Family Medicine

## 2022-04-18 VITALS — BP 115/80 | HR 80 | Temp 97.7°F | Ht 65.0 in | Wt 220.0 lb

## 2022-04-18 DIAGNOSIS — H6991 Unspecified Eustachian tube disorder, right ear: Secondary | ICD-10-CM | POA: Diagnosis not present

## 2022-04-18 NOTE — Patient Instructions (Signed)
Over-the-counter nasal steroid.  Zyrtec-D or Claritin-D.   If continues to be troublesome, consider returning to ENT.

## 2022-04-18 NOTE — Assessment & Plan Note (Signed)
Patient experiencing eustachian tube dysfunction.  There is no evidence of effusion or infection.  Advised over-the-counter nasal steroid as well as Zyrtec-D or Claritin-D.

## 2022-04-18 NOTE — Progress Notes (Signed)
Subjective:  Patient ID: Brandi Oconnell, female    DOB: 1991-05-17  Age: 31 y.o. MRN: [id number]  CC: Chief Complaint  Patient presents with   muffled hearing     HPI:  31 year old female presents for evaluation of the above.  Patient reports muffled hearing since yesterday morning.  Right ear greater than left ear.  Has had prior sinus surgery.  She is concerned and wants her ears examined today.  No other associated symptoms.  No other complaints or concerns at this time.  Patient Active Problem List   Diagnosis Date Noted   Dysfunction of right eustachian tube 04/18/2022   Obesity 01/27/2022   Annual physical exam 07/10/2021   Anxiety 06/21/2021   Flat affect 03/09/2021   Dyshidrotic eczema 12/08/2020   Depression, recurrent ([place]) 10/27/2020   Chronic idiopathic constipation 07/15/2013    Social Hx   Social History   Socioeconomic History   Marital status: Married    Spouse name: Not on file   Number of children: Not on file   Years of education: Not on file   Highest education level: Not on file  Occupational History   Not on file  Tobacco Use   Smoking status: Never   Smokeless tobacco: Never  Vaping Use   Vaping Use: Never used  Substance and Sexual Activity   Alcohol use: No   Drug use: No   Sexual activity: Yes    Birth control/protection: Pill  Other Topics Concern   Not on file  Social History Narrative   Not on file   Social Determinants of Health   Financial Resource Strain: Low Risk  (03/10/2018)   Overall Financial Resource Strain (CARDIA)    Difficulty of Paying Living Expenses: Not hard at all  Food Insecurity: No Food Insecurity (03/10/2018)   Hunger Vital Sign    Worried About Running Out of Food in the Last Year: Never true    [place] in the Last Year: Never true  Transportation Needs: Unknown (03/10/2018)   PRAPARE - Hydrologist (Medical): No    Lack of Transportation (Non-Medical): Not on file   Physical Activity: Inactive (03/10/2018)   Exercise Vital Sign    Days of Exercise per Week: 0 days    Minutes of Exercise per Session: 0 min  Stress: No Stress Concern Present (03/10/2018)   [place]    Feeling of Stress : Not at all  Social Connections: Not on file    Review of Systems Per HPI  Objective:  BP 115/80   Pulse 80   Temp 97.7 F (36.5 C)   Ht 5\' 5"  (1.651 m)   Wt 220 lb (99.8 kg)   SpO2 97%   BMI 36.61 kg/m      04/18/2022    9:29 AM 04/02/2022    8:41 AM 01/24/2022    2:03 PM  BP/Weight  Systolic BP [id number]  Diastolic BP 80 78 80  Wt. (Lbs) 220 220 222  BMI 36.61 kg/m2 36.61 kg/m2 36.94 kg/m2    Physical Exam Vitals and nursing note reviewed.  Constitutional:      General: She is not in acute distress.    Appearance: Normal appearance.  HENT:     Head: Normocephalic and atraumatic.     Right Ear: Tympanic membrane and ear canal normal. There is no impacted cerumen.     Left Ear: Tympanic membrane and  ear canal normal. There is no impacted cerumen.  Eyes:     General:        Right eye: No discharge.        Left eye: No discharge.     Conjunctiva/sclera: Conjunctivae normal.  Pulmonary:     Effort: Pulmonary effort is normal. No respiratory distress.  Neurological:     Mental Status: She is alert.     Lab Results  Component Value Date   WBC 9.8 07/10/2021   HGB 14.5 07/10/2021   HCT 43.0 07/10/2021   PLT 338 07/10/2021   GLUCOSE 90 07/10/2021   CHOL 167 07/10/2021   TRIG 94 07/10/2021   HDL 57 07/10/2021   LDLCALC 93 07/10/2021   ALT 24 07/10/2021   AST 19 07/10/2021   NA 136 07/10/2021   K 4.1 07/10/2021   CL 100 07/10/2021   CREATININE 0.95 07/10/2021   BUN 14 07/10/2021   CO2 24 07/10/2021   TSH 2.070 12/17/2015   HGBA1C 5.5 07/10/2021     Assessment & Plan:   Problem List Items Addressed This Visit       Nervous and Auditory   Dysfunction of  right eustachian tube - Primary    Patient experiencing eustachian tube dysfunction.  There is no evidence of effusion or infection.  Advised over-the-counter nasal steroid as well as Zyrtec-D or Claritin-D.       Follow-up:  Return if symptoms worsen or fail to improve.  [place]

## 2022-04-25 ENCOUNTER — Other Ambulatory Visit: Payer: Self-pay | Admitting: Nurse Practitioner

## 2022-04-25 DIAGNOSIS — F339 Major depressive disorder, recurrent, unspecified: Secondary | ICD-10-CM

## 2022-04-25 DIAGNOSIS — F419 Anxiety disorder, unspecified: Secondary | ICD-10-CM

## 2022-05-09 ENCOUNTER — Telehealth: Payer: Self-pay | Admitting: Nurse Practitioner

## 2022-05-09 ENCOUNTER — Telehealth: Payer: BC Managed Care – PPO | Admitting: Nurse Practitioner

## 2022-05-09 DIAGNOSIS — F419 Anxiety disorder, unspecified: Secondary | ICD-10-CM | POA: Diagnosis not present

## 2022-05-09 DIAGNOSIS — F339 Major depressive disorder, recurrent, unspecified: Secondary | ICD-10-CM | POA: Diagnosis not present

## 2022-05-09 MED ORDER — BUPROPION HCL ER (XL) 300 MG PO TB24: 300.0000 mg | ORAL_TABLET | Freq: Every day | ORAL | 1 refills | Status: DC

## 2022-05-09 NOTE — Telephone Encounter (Signed)
Ms. bryana, froemming are scheduled for a virtual visit with your provider today.    Just as we do with appointments in the office, we must obtain your consent to participate.  Your consent will be active for this visit and any virtual visit you may have with one of our providers in the next 365 days.    If you have a MyChart account, I can also send a copy of this consent to you electronically.  All virtual visits are billed to your insurance company just like a traditional visit in the office.  As this is a virtual visit, video technology does not allow for your provider to perform a traditional examination.  This may limit your provider's ability to fully assess your condition.  If your provider identifies any concerns that need to be evaluated in person or the need to arrange testing such as labs, EKG, etc, we will make arrangements to do so.    Although advances in technology are sophisticated, we cannot ensure that it will always work on either your end or our end.  If the connection with a video visit is poor, we may have to switch to a telephone visit.  With either a video or telephone visit, we are not always able to ensure that we have a secure connection.   I need to obtain your verbal consent now.   Are you willing to proceed with your visit today?   DOMIQUE REARDON has provided verbal consent on 05/09/2022 for a virtual visit (video or telephone).   Vicente Males, LPN 56/09/1495  02:63 AM

## 2022-05-09 NOTE — Progress Notes (Unsigned)
   Virtual Visit via Telephone Note  I connected with Brandi Oconnell on 05/09/22 at 10:20 AM EDT by telephone and verified that I am speaking with the correct person using two identifiers. An attempt was made for a video visit but patient's audio would not connect so this was switched to a phone visit.  Location: Patient: home Provider: office   I discussed the limitations, risks, security and privacy concerns of performing an evaluation and management service by telephone and the availability of in person appointments. I also discussed with the patient that there may be a patient responsible charge related to this service. The patient expressed understanding and agreed to proceed.   History of Present Illness: Presents by phone for recheck on her depression and anxiety.  Patient went through the process online for genetic screening for her psych meds.  States she paid well over $300 and really has not had any results.  No salivary sample was done as ordered.  Encourage patient to contact the company and if needed get a refund.  In the meantime is mostly stable on bupropion and escitalopram.  Normal sleep.  About once a week has to isolate herself due to agitation.  States this is not usual for her.  Denies any suicidal or homicidal thoughts or ideation.  Denies any self-harm behaviors.  Her FMLA will run out, return to work on 05/18/2022.   Observations/Objective: Alert, oriented.  Speech clear.  Calm affect.  Assessment and Plan: Problem List Items Addressed This Visit       Other   Anxiety   Relevant Medications   buPROPion (WELLBUTRIN XL) 300 MG 24 hr tablet   Depression, recurrent (HCC) - Primary   Relevant Medications   buPROPion (WELLBUTRIN XL) 300 MG 24 hr tablet   Meds ordered this encounter  Medications   buPROPion (WELLBUTRIN XL) 300 MG 24 hr tablet    Sig: Take 1 tablet (300 mg total) by mouth daily.    Dispense:  90 tablet    Refill:  1    Order Specific Question:    Supervising Provider    Answer:   Sallee Lange A [[id number]]     Follow Up Instructions: Continue current medications as directed.  Encourage patient to contact the company and clarify what services are going to be offered.  Contact the office if we can be of assistance.  An email was sent out to find how to check on the original order. Return in about 3 months (around 08/09/2022). Call back sooner if needed.   I discussed the assessment and treatment plan with the patient. The patient was provided an opportunity to ask questions and all were answered. The patient agreed with the plan and demonstrated an understanding of the instructions.   The patient was advised to call back or seek an in-person evaluation if the symptoms worsen or if the condition fails to improve as anticipated.  I provided 15 minutes of non-face-to-face time during this encounter.   Nilda Simmer, NP        Review of Systems     Objective:   Physical Exam        Assessment & Plan:

## 2022-05-10 ENCOUNTER — Encounter: Payer: Self-pay | Admitting: Nurse Practitioner

## 2022-05-12 ENCOUNTER — Telehealth: Payer: Self-pay

## 2022-05-12 NOTE — Telephone Encounter (Signed)
Caller name: ROZALIA DINO  On DPR?: Yes  Call back number: [phone] (mobile)  Provider they see: Pearson Forster   Reason for call:Pt wants referral to Dr Modesta Messing [facility]. Pt was seen last week with Hoyle Sauer

## 2022-05-13 ENCOUNTER — Other Ambulatory Visit: Payer: Self-pay | Admitting: Nurse Practitioner

## 2022-05-13 ENCOUNTER — Encounter: Payer: Self-pay | Admitting: Nurse Practitioner

## 2022-05-13 DIAGNOSIS — F339 Major depressive disorder, recurrent, unspecified: Secondary | ICD-10-CM

## 2022-05-13 DIAGNOSIS — F419 Anxiety disorder, unspecified: Secondary | ICD-10-CM

## 2022-05-13 DIAGNOSIS — R4589 Other symptoms and signs involving emotional state: Secondary | ICD-10-CM

## 2022-05-13 NOTE — Telephone Encounter (Signed)
Done

## 2022-05-16 ENCOUNTER — Encounter: Payer: Self-pay | Admitting: Nurse Practitioner

## 2022-05-16 DIAGNOSIS — F331 Major depressive disorder, recurrent, moderate: Secondary | ICD-10-CM | POA: Diagnosis not present

## 2022-05-16 DIAGNOSIS — F411 Generalized anxiety disorder: Secondary | ICD-10-CM | POA: Diagnosis not present

## 2022-05-23 ENCOUNTER — Ambulatory Visit: Payer: BC Managed Care – PPO | Admitting: Nurse Practitioner

## 2022-06-13 ENCOUNTER — Encounter: Payer: Self-pay | Admitting: Nurse Practitioner

## 2022-06-25 DIAGNOSIS — Z01419 Encounter for gynecological examination (general) (routine) without abnormal findings: Secondary | ICD-10-CM | POA: Diagnosis not present

## 2022-06-25 DIAGNOSIS — Z113 Encounter for screening for infections with a predominantly sexual mode of transmission: Secondary | ICD-10-CM | POA: Diagnosis not present

## 2022-06-25 DIAGNOSIS — Z124 Encounter for screening for malignant neoplasm of cervix: Secondary | ICD-10-CM | POA: Diagnosis not present

## 2022-06-25 DIAGNOSIS — Z6836 Body mass index (BMI) 36.0-36.9, adult: Secondary | ICD-10-CM | POA: Diagnosis not present

## 2022-07-22 ENCOUNTER — Ambulatory Visit: Payer: BC Managed Care – PPO | Admitting: Psychiatry

## 2022-07-22 ENCOUNTER — Other Ambulatory Visit
Admission: RE | Admit: 2022-07-22 | Discharge: 2022-07-22 | Disposition: A | Discharge status: Home / Self Care | Payer: BC Managed Care – PPO | Source: Ambulatory Visit | Attending: Psychiatry | Admitting: Psychiatry

## 2022-07-22 ENCOUNTER — Encounter: Payer: Self-pay | Admitting: Psychiatry

## 2022-07-22 VITALS — BP 112/72 | HR 87 | Temp 98.3°F | Ht 65.0 in | Wt 218.6 lb

## 2022-07-22 DIAGNOSIS — F411 Generalized anxiety disorder: Secondary | ICD-10-CM | POA: Insufficient documentation

## 2022-07-22 DIAGNOSIS — F32A Depression, unspecified: Secondary | ICD-10-CM | POA: Diagnosis not present

## 2022-07-22 LAB — TSH: TSH: 0.749 u[IU]/mL (ref 0.350–4.500)

## 2022-07-22 MED ORDER — ESCITALOPRAM OXALATE 10 MG PO TABS: 15.0000 mg | ORAL_TABLET | Freq: Every day | ORAL | 1 refills | Status: DC

## 2022-07-22 NOTE — Patient Instructions (Signed)
  [email or web address]  [email or web address]  [email or web address] [address], [place], [place] [address]  ~20.2 mi [phone]  [facility], [email or web address] [address] [place], [place] [address]  ~20.9 mi [phone]   [facility] - [phone]  [facility] - [phone]  [facility] - [place] [phone]  [facility] - [phone]

## 2022-07-22 NOTE — Progress Notes (Signed)
Psychiatric Initial Adult Assessment   Patient Identification: Brandi Oconnell MRN:  [id number] Date of Evaluation:  07/22/2022 Referral Source: Thersa Salt DO Chief Complaint:   Chief Complaint  Patient presents with   Establish Care   Anxiety   Depression   Medication Problem   Visit Diagnosis:    ICD-10-CM   1. GAD (generalized anxiety disorder)  F41.1 TSH    escitalopram (LEXAPRO) 10 MG tablet    2. Depressive disorder  F32.A TSH    escitalopram (LEXAPRO) 10 MG tablet   unspecified      History of Present Illness:  Brandi Oconnell is a 32 year old Caucasian female, employed, married, lives in [place], has a history of irritable bowel syndrome, depression, anxiety, GERD was evaluated in office today.  Patient reports she has been struggling with depression and anxiety since the past 2 years or so.  May have started after the birth of her son who is currently 48 years old.  Reports she was started on psychotropic medications like Wellbutrin by her primary care provider.  Wellbutrin was gradually increased to 300 mg and then Lexapro was added for anxiety since Wellbutrin was not very beneficial for the anxiety part.  She reports she is currently taking Lexapro 20 mg p.o. daily since the past 1-1/2 years or so.  Reports initially it helped however most recently she has been struggling with mild lack of motivation or interest when it comes to making social contacts, interaction.  She reports she is happy if she can just stay home and read a book rather than getting out.  She however reports she has not had any significant trouble otherwise.  She used to struggle with a lot of anxiety especially in social situations like going to the grocery store.  However that has improved and it does not affect her much anymore.  She has no trouble going to work or taking care of her job related needs.  She does not believe she has any trouble keeping up with projects at work.  She does not believe she  is making any careless mistakes.  Denies any significant mood swings.  Denies any perceptual disturbances.  Denies any significant manic or hypomanic symptoms.  Reports sleep is overall okay.  Feels rested when she wakes up in the morning.  Does report she witnessed a lot of disagreement between her parents while they were going through the divorce.  That was traumatic for her.  However denies any other trauma growing up.  Denies any PTSD symptoms.  Patient denies any suicidality, homicidality.  Patient reports good support system from family.  Currently not in psychotherapy, agreeable to start therapy.  Patient denies any other concerns today.   Associated Signs/Symptoms: Depression Symptoms:   lack of interest in social interactions (Hypo) Manic Symptoms:   Denies Anxiety Symptoms:  Social Anxiety, Psychotic Symptoms:   Denies PTSD Symptoms: Had a traumatic exposure:  as noted above  Past Psychiatric History: Patient reports she was briefly under the care of a psychologist when she was in middle school while her parents were going through the divorce.  She also had marriage counseling for around 6 months 2 years ago.  Denies inpatient behavioral health admissions.  Denies suicide attempts.  Previous Psychotropic Medications: Yes Lexapro, Wellbutrin  Substance Abuse History in the last 12 months:  No.  Consequences of Substance Abuse: Negative  Past Medical History:  Past Medical History:  Diagnosis Date   Allergy    Anxiety  Depression    Frequent UTI    GERD (gastroesophageal reflux disease)    IBS (irritable bowel syndrome)    Migraine headache     Past Surgical History:  Procedure Laterality Date   CESAREAN SECTION N/A 03/13/2018   Procedure: CESAREAN SECTION with bladder irrigation;  Surgeon: Louretta Shorten, MD;  Location: [facility];  Service: Obstetrics;  Laterality: N/A;   WISDOM TOOTH EXTRACTION  11/2012    Family Psychiatric History: As noted  below.  Family History:  Family History  Problem Relation Age of Onset   Anxiety disorder Mother    Depression Mother    Cancer Mother        melonoma   Depression Maternal Grandfather    Heart disease Maternal Grandfather    Hypertension Maternal Grandfather    Depression Maternal Grandmother    Cancer Maternal Grandmother        melonoma   Thyroid disease Maternal Grandmother    Rheum arthritis Maternal Grandmother    Hypertension Paternal Grandfather    Hyperlipidemia Paternal Grandfather    Diabetes Paternal Grandfather     Social History:   Social History   Socioeconomic History   Marital status: Married    Spouse name: Not on file   Number of children: 1   Years of education: Not on file   Highest education level: Bachelor's degree (e.g., BA, AB, BS)  Occupational History   Not on file  Tobacco Use   Smoking status: Never   Smokeless tobacco: Never  Vaping Use   Vaping Use: Never used  Substance and Sexual Activity   Alcohol use: No   Drug use: No   Sexual activity: Yes  Other Topics Concern   Not on file  Social History Narrative   Not on file   Social Determinants of Health   Financial Resource Strain: Low Risk  (03/10/2018)   Overall Financial Resource Strain (CARDIA)    Difficulty of Paying Living Expenses: Not hard at all  Food Insecurity: No Food Insecurity (03/10/2018)   Hunger Vital Sign    Worried About Running Out of Food in the Last Year: Never true    [place] in the Last Year: Never true  Transportation Needs: Unknown (03/10/2018)   PRAPARE - Hydrologist (Medical): No    Lack of Transportation (Non-Medical): Not on file  Physical Activity: Inactive (03/10/2018)   Exercise Vital Sign    Days of Exercise per Week: 0 days    Minutes of Exercise per Session: 0 min  Stress: No Stress Concern Present (03/10/2018)   [place]    Feeling of Stress :  Not at all  Social Connections: Not on file    Additional Social History: Patient was born and raised in [place] primarily by her mother.  Dad was not very invested.  Later on mom and dad divorced when she was in middle school.  Patient denies any sexual or physical or verbal abuse.  Patient graduated high school, has a bachelor's degree in business administration.  She is currently married since 2014.  Patient has a son who is 37 years old.  She currently lives in [place] with her family.  Patient denies any legal problems.  Patient denies being in the [employer].  She is religious.  Allergies:   Allergies  Allergen Reactions   Hydrocodone     nausea   Oxycodone     nausea  Penicillins Hives    Has patient had a PCN reaction causing immediate rash, facial/tongue/throat swelling, SOB or lightheadedness with hypotension: yes Has patient had a PCN reaction causing severe rash involving mucus membranes or skin necrosis: Yes Has patient had a PCN reaction that required hospitalization: No Has patient had a PCN reaction occurring within the last 10 years: No If all of the above answers are "NO", then may proceed with Cephalosporin use.     Metabolic Disorder Labs: Lab Results  Component Value Date   HGBA1C 5.5 07/10/2021   No results found for: "PROLACTIN" Lab Results  Component Value Date   CHOL 167 07/10/2021   TRIG 94 07/10/2021   HDL 57 07/10/2021   CHOLHDL 2.9 07/10/2021   LDLCALC 93 07/10/2021   LDLCALC 92 12/17/2015   Lab Results  Component Value Date   TSH 0.749 07/22/2022    Therapeutic Level Labs: No results found for: "LITHIUM" No results found for: "CBMZ" No results found for: "VALPROATE"  Current Medications: Current Outpatient Medications  Medication Sig Dispense Refill   albuterol (VENTOLIN HFA) 108 (90 Base) MCG/ACT inhaler Inhale 1-2 puffs into the lungs every 6 (six) hours as needed for wheezing or shortness of breath. 18 g 0   buPROPion (WELLBUTRIN  XL) 300 MG 24 hr tablet Take 1 tablet (300 mg total) by mouth daily. 90 tablet 1   escitalopram (LEXAPRO) 10 MG tablet Take 1.5 tablets (15 mg total) by mouth daily. Stop lexapro 20 mg 45 tablet 1   Lactobacillus (PROBIOTIC ACIDOPHILUS PO) Take by mouth.     ondansetron (ZOFRAN-ODT) 4 MG disintegrating tablet Take 1 tablet (4 mg total) by mouth every 8 (eight) hours as needed for nausea or vomiting. 20 tablet 0   No current facility-administered medications for this visit.    Musculoskeletal: Strength & Muscle Tone: within normal limits Gait & Station: normal Patient leans: N/A  Psychiatric Specialty Exam: Review of Systems  Psychiatric/Behavioral:  The patient is nervous/anxious.   All other systems reviewed and are negative.   Blood pressure 112/72, pulse 87, temperature 98.3 F (36.8 C), temperature source Brandi, height 5\' 5"  (1.651 m), weight 218 lb 9.6 oz (99.2 kg).Body mass index is 36.38 kg/m.  General Appearance: Casual  Eye Contact:  Good  Speech:  Clear and Coherent  Volume:  Normal  Mood:  Anxious  Affect:  Appropriate  Thought Process:  Goal Directed and Descriptions of Associations: Intact  Orientation:  Full (Time, Place, and Person)  Thought Content:  Logical  Suicidal Thoughts:  No  Homicidal Thoughts:  No  Memory:  Immediate;   Fair Recent;   Fair Remote;   Fair  Judgement:  Fair  Insight:  Fair  Psychomotor Activity:  Normal  Concentration:  Concentration: Fair and Attention Span: Fair  Recall:  of Knowledge:Fair  Language: Fair  Akathisia:  No  Handed:  Right  AIMS (if indicated):  not done  Assets:  Communication Skills Desire for Improvement Housing Social Support  ADL's:  Intact  Cognition: WNL  Sleep:  Fair   Screenings: GAD-7    Flowsheet Row Office Visit from 07/22/2022 in [facility] Office Visit from 10/25/2021 in [facility] Video Visit from 03/08/2021 in [facility]  Office Visit from 10/26/2019 in [facility]  Total GAD-7 Score 6 11 8 13       PHQ2-9    Flowsheet Row Office Visit from 07/22/2022 in [facility] Office Visit from  04/18/2022 in [facility] Office Visit from 04/02/2022 in [facility] Office Visit from 07/09/2021 in [facility] Video Visit from 03/08/2021 in [facility]  PHQ-2 Total Score 2 0 0 0 2  PHQ-9 Total Score 5 -- -- 1 14      Flowsheet Row Office Visit from 07/22/2022 in [facility] ED from 04/14/2021 in [facility] [place]  C-SSRS RISK CATEGORY No Risk No Risk       Assessment and Plan: DREAMA KUNA is a 32 year old Caucasian female who has a history of depression, anxiety, gastroesophageal reflux disease, irritable bowel syndrome was evaluated in office today.  Patient with overall good response to current psychotropic medication however does report some lack of interest, unknown if likely side effect to being on higher dosages of antidepressants.  Patient will benefit from psychotherapy, discussed plan as noted below. The patient demonstrates the following risk factors for suicide: Chronic risk factors for suicide include: psychiatric disorder of depression, anxiety . Acute risk factors for suicide include:  none . Protective factors for this patient include: positive social support, positive therapeutic relationship, responsibility to others (children, family), coping skills, hope for the future, religious beliefs against suicide, and life satisfaction. Considering these factors, the overall suicide risk at this point appears to be low. Patient is appropriate for outpatient follow up.  Plan Anxiety disorder unspecified-improving Reduce Lexapro to 15 mg p.o. daily.  Unknown if she currently has side effects or not, patient to monitor her symptoms closely. Referral for CBT, communicated  with staff to schedule this patient with our therapist also gave resources in the community.  Depression unspecified-rule out MDD-improving Patient however with residual symptoms, unknown if due to depression versus side effect of medications. Will consider lowering the dosage as noted above.  Will reevaluate patient. Continue Wellbutrin XL 300 mg p.o. daily Patient advised to sign a release to obtain medical records, including genetic testing which was completed recently.  Will review genetic testing report prior to making more medication changes.   Will order labs-TSH-patient to go to [facility] lab.  Also will request lab reports recently completed by primary care provider.  Discussed with patient.  Follow-up in clinic in 8 weeks or sooner if needed.    This note was generated in part or whole with voice recognition software. Voice recognition is usually quite accurate but there are transcription errors that can and very often do occur. I apologize for any typographical errors that were not detected and corrected.    Jomarie Longs, MD 1/16/20243:20 PM

## 2022-08-21 ENCOUNTER — Encounter: Payer: Self-pay | Admitting: Family Medicine

## 2022-09-10 ENCOUNTER — Ambulatory Visit: Payer: BC Managed Care – PPO | Admitting: Family Medicine

## 2022-09-16 ENCOUNTER — Telehealth (INDEPENDENT_AMBULATORY_CARE_PROVIDER_SITE_OTHER): Payer: Self-pay | Admitting: Psychiatry

## 2022-09-16 DIAGNOSIS — F32A Depression, unspecified: Secondary | ICD-10-CM

## 2022-09-16 DIAGNOSIS — F411 Generalized anxiety disorder: Secondary | ICD-10-CM

## 2022-09-16 NOTE — Progress Notes (Signed)
Contacted patient by phone at the time of visit since patient did not connect via video.  Patient reported that she was at work and had forgotten about this appointment.  Patient would like to reschedule.  I have also notified staff.

## 2022-11-17 ENCOUNTER — Ambulatory Visit (HOSPITAL_COMMUNITY)
Admission: RE | Admit: 2022-11-17 | Discharge: 2022-11-17 | Disposition: A | Discharge status: Home / Self Care | Payer: BC Managed Care – PPO | Source: Ambulatory Visit | Attending: Family Medicine | Admitting: Family Medicine

## 2022-11-17 ENCOUNTER — Ambulatory Visit (INDEPENDENT_AMBULATORY_CARE_PROVIDER_SITE_OTHER): Payer: BC Managed Care – PPO | Admitting: Family Medicine

## 2022-11-17 ENCOUNTER — Encounter: Payer: Self-pay | Admitting: Family Medicine

## 2022-11-17 VITALS — BP 126/77 | HR 70 | Temp 98.1°F | Ht 65.0 in | Wt 221.0 lb

## 2022-11-17 DIAGNOSIS — E669 Obesity, unspecified: Secondary | ICD-10-CM

## 2022-11-17 DIAGNOSIS — Z13220 Encounter for screening for lipoid disorders: Secondary | ICD-10-CM | POA: Diagnosis not present

## 2022-11-17 DIAGNOSIS — K625 Hemorrhage of anus and rectum: Secondary | ICD-10-CM | POA: Insufficient documentation

## 2022-11-17 DIAGNOSIS — Z6836 Body mass index (BMI) 36.0-36.9, adult: Secondary | ICD-10-CM | POA: Diagnosis not present

## 2022-11-17 DIAGNOSIS — N898 Other specified noninflammatory disorders of vagina: Secondary | ICD-10-CM | POA: Diagnosis not present

## 2022-11-17 DIAGNOSIS — E66812 Obesity, class 2: Secondary | ICD-10-CM

## 2022-11-17 MED ORDER — METRONIDAZOLE 500 MG PO TABS: 500.0000 mg | ORAL_TABLET | Freq: Two times a day (BID) | ORAL | 0 refills | Status: AC

## 2022-11-17 NOTE — Assessment & Plan Note (Addendum)
Stopped suspected internal hemorrhoids.  Needs colonoscopy or anoscopy.  CBC today.  KUB was obtained to assess for underlying constipation.  Independently reviewed by me.  Interpretation: Normal bowel gas pattern.  Referring to GI.

## 2022-11-17 NOTE — Patient Instructions (Signed)
Medication as prescribed.  X-ray at the hospital.  Lab today.  Referring to GI.  Suspected internal hemorrhoids.  If bleeding worsens, please let me know.

## 2022-11-17 NOTE — Assessment & Plan Note (Signed)
Discharge noted on exam.  Consistent with BV.  Treating with Flagyl.

## 2022-11-17 NOTE — Progress Notes (Signed)
Subjective:  Patient ID: Brandi Oconnell, female    DOB: 11-24-1990  Age: 32 y.o. MRN: [id number]  CC: Chief Complaint  Patient presents with   Blood In Stools    Noticed in toilet for a couple of months , normal stools, no constipation   Vaginal Itching    And discharge    HPI:  32 year old female presents for evaluation of the above.   Patient reports that over the past 2 months she has had several episodes of bright red blood per rectum.  She has a history of constipation but states that she has not had any constipation recently.  No diarrhea.  No passage of mucus.  No abdominal pain.  No significant rectal pain.  No known inciting factor.  No relieving factors.  Patient also reports ongoing vaginal discharge.  Some itching.  Discharge is thin.  Denies any concerns for STDs.  Patient Active Problem List   Diagnosis Date Noted   BRBPR (bright red blood per rectum) 11/17/2022   Vaginal discharge 11/17/2022   GAD (generalized anxiety disorder) 07/22/2022   Obesity 01/27/2022   Anxiety 06/21/2021   Flat affect 03/09/2021   Dyshidrotic eczema 12/08/2020   Depressive disorder 10/27/2020    Social Hx   Social History   Socioeconomic History   Marital status: Married    Spouse name: Not on file   Number of children: 1   Years of education: Not on file   Highest education level: Bachelor's degree (e.g., BA, AB, BS)  Occupational History   Not on file  Tobacco Use   Smoking status: Never   Smokeless tobacco: Never  Vaping Use   Vaping Use: Never used  Substance and Sexual Activity   Alcohol use: No   Drug use: No   Sexual activity: Yes  Other Topics Concern   Not on file  Social History Narrative   Not on file   Social Determinants of Health   Financial Resource Strain: Low Risk  (11/14/2022)   Overall Financial Resource Strain (CARDIA)    Difficulty of Paying Living Expenses: Not very hard  Food Insecurity: No Food Insecurity (11/14/2022)   Hunger Vital Sign     Worried About Running Out of Food in the Last Year: Never true    Ran Out of Food in the Last Year: Never true  Transportation Needs: No Transportation Needs (11/14/2022)   PRAPARE - Administrator, Civil Service (Medical): No    Lack of Transportation (Non-Medical): No  Physical Activity: Sufficiently Active (11/14/2022)   Exercise Vital Sign    Days of Exercise per Week: 7 days    Minutes of Exercise per Session: 30 min  Stress: No Stress Concern Present (11/14/2022)   [employer] of Occupational Health - Occupational Stress Questionnaire    Feeling of Stress : Only a little  Social Connections: Moderately Integrated (11/14/2022)   Social Connection and Isolation Panel [NHANES]    Frequency of Communication with Friends and Family: More than three times a week    Frequency of Social Gatherings with Friends and Family: Three times a week    Attends Religious Services: 1 to 4 times per year    Active Member of Clubs or Organizations: No    Attends Engineer, structural: Not on file    Marital Status: Married    Review of Systems Per HPI  Objective:  BP 126/77   Pulse 70   Temp 98.1 F (36.7 C)  Ht 5\' 5"  (1.651 m)   Wt 221 lb (100.2 kg)   LMP 10/14/2022   SpO2 98%   BMI 36.78 kg/m      11/17/2022    1:53 PM 07/22/2022   10:06 AM 04/18/2022    9:29 AM  BP/Weight  Systolic BP 126  409  Diastolic BP 77  80  Wt. (Lbs) 221  220  BMI 36.78 kg/m2  36.61 kg/m2     Information is confidential and restricted. Go to Review Flowsheets to unlock data.    Physical Exam Vitals and nursing note reviewed. Exam conducted with a chaperone present.  Constitutional:      Appearance: Normal appearance.  HENT:     Head: Normocephalic and atraumatic.  Genitourinary:    General: Normal vulva.     Vagina: Vaginal discharge present.     Comments: Rectum - evidence of prior hemorrhoids noted with excess skin. No fissure or other abnormalities.  Neurological:      Mental Status: She is alert.  Psychiatric:        Mood and Affect: Mood normal.        Behavior: Behavior normal.     Lab Results  Component Value Date   WBC 9.8 07/10/2021   HGB 14.5 07/10/2021   HCT 43.0 07/10/2021   PLT 338 07/10/2021   GLUCOSE 90 07/10/2021   CHOL 167 07/10/2021   TRIG 94 07/10/2021   HDL 57 07/10/2021   LDLCALC 93 07/10/2021   ALT 24 07/10/2021   AST 19 07/10/2021   NA 136 07/10/2021   K 4.1 07/10/2021   CL 100 07/10/2021   CREATININE 0.95 07/10/2021   BUN 14 07/10/2021   CO2 24 07/10/2021   TSH 0.749 07/22/2022   HGBA1C 5.5 07/10/2021     Assessment & Plan:   Problem List Items Addressed This Visit       Digestive   BRBPR (bright red blood per rectum) - Primary    Stopped suspected internal hemorrhoids.  Needs colonoscopy or anoscopy.  CBC today.  KUB was obtained to assess for underlying constipation.  Independently reviewed by me.  Interpretation: Normal bowel gas pattern.  Referring to GI.      Relevant Orders   DG Abd 1 View (Completed)   CBC   Ambulatory referral to Gastroenterology     Other   Obesity   Relevant Orders   CMP14+EGFR   Vaginal discharge    Discharge noted on exam.  Consistent with BV.  Treating with Flagyl.      Other Visit Diagnoses     Screening, lipid       Relevant Orders   Lipid panel       Meds ordered this encounter  Medications   metroNIDAZOLE (FLAGYL) 500 MG tablet    Sig: Take 1 tablet (500 mg total) by mouth 2 (two) times daily for 7 days.    Dispense:  14 tablet    Refill:  0    Follow-up:  Return if symptoms worsen or fail to improve.  Everlene Other DO [facility]

## 2022-11-18 LAB — LIPID PANEL
Chol/HDL Ratio: 3.6 ratio (ref 0.0–4.4)
Cholesterol, Total: 169 mg/dL (ref 100–199)
HDL: 47 mg/dL (ref >39)
LDL Chol Calc (NIH): 104 mg/dL — ABNORMAL HIGH (ref 0–99)
Triglycerides: 95 mg/dL (ref 0–149)
VLDL Cholesterol Cal: 18 mg/dL (ref 5–40)

## 2022-11-18 LAB — CMP14+EGFR
ALT: 21 IU/L (ref 0–32)
AST: 19 IU/L (ref 0–40)
Albumin/Globulin Ratio: 2 (ref 1.2–2.2)
Albumin: 4.7 g/dL (ref 4.0–5.0)
Alkaline Phosphatase: 82 IU/L (ref 44–121)
BUN/Creatinine Ratio: 11 (ref 9–23)
BUN: 11 mg/dL (ref 6–20)
Bilirubin Total: 0.7 mg/dL (ref 0.0–1.2)
CO2: 21 mmol/L (ref 20–29)
Calcium: 9.4 mg/dL (ref 8.7–10.2)
Chloride: 103 mmol/L (ref 96–106)
Creatinine, Ser: 1 mg/dL (ref 0.57–1.00)
Globulin, Total: 2.4 g/dL (ref 1.5–4.5)
Glucose: 90 mg/dL (ref 70–99)
Potassium: 4.2 mmol/L (ref 3.5–5.2)
Sodium: 141 mmol/L (ref 134–144)
Total Protein: 7.1 g/dL (ref 6.0–8.5)
eGFR: 78 mL/min/{1.73_m2} (ref >59)

## 2022-11-18 LAB — CBC
Hematocrit: 45.8 % (ref 34.0–46.6)
Hemoglobin: 15.6 g/dL (ref 11.1–15.9)
MCH: 29.3 pg (ref 26.6–33.0)
MCHC: 34.1 g/dL (ref 31.5–35.7)
MCV: 86 fL (ref 79–97)
Platelets: 336 10*3/uL (ref 150–450)
RBC: 5.33 x10E6/uL — ABNORMAL HIGH (ref 3.77–5.28)
RDW: 12.3 % (ref 11.7–15.4)
WBC: 9.6 10*3/uL (ref 3.4–10.8)

## 2022-12-10 ENCOUNTER — Encounter: Payer: Self-pay | Admitting: Family Medicine

## 2022-12-10 ENCOUNTER — Ambulatory Visit (INDEPENDENT_AMBULATORY_CARE_PROVIDER_SITE_OTHER): Payer: BC Managed Care – PPO | Admitting: Family Medicine

## 2022-12-10 VITALS — BP 102/57 | HR 82 | Temp 98.1°F | Ht 65.0 in | Wt 223.0 lb

## 2022-12-10 DIAGNOSIS — R300 Dysuria: Secondary | ICD-10-CM | POA: Diagnosis not present

## 2022-12-10 DIAGNOSIS — N3000 Acute cystitis without hematuria: Secondary | ICD-10-CM | POA: Diagnosis not present

## 2022-12-10 DIAGNOSIS — R350 Frequency of micturition: Secondary | ICD-10-CM

## 2022-12-10 DIAGNOSIS — N390 Urinary tract infection, site not specified: Secondary | ICD-10-CM | POA: Insufficient documentation

## 2022-12-10 LAB — POCT URINALYSIS DIP (CLINITEK)
Bilirubin, UA: NEGATIVE
Blood, UA: NEGATIVE
Glucose, UA: NEGATIVE mg/dL
Ketones, POC UA: NEGATIVE mg/dL
Nitrite, UA: NEGATIVE
Spec Grav, UA: 1.01 (ref 1.010–1.025)
Urobilinogen, UA: 0.2 E.U./dL
pH, UA: 7 (ref 5.0–8.0)

## 2022-12-10 MED ORDER — SULFAMETHOXAZOLE-TRIMETHOPRIM 800-160 MG PO TABS: 1.0000 | ORAL_TABLET | Freq: Two times a day (BID) | ORAL | 0 refills | Status: AC

## 2022-12-10 NOTE — Assessment & Plan Note (Signed)
UA consistent with UTI.  Sending culture.  Placing on Bactrim.

## 2022-12-10 NOTE — Patient Instructions (Signed)
Antibiotic as prescribed while awaiting culture. ? ? ?Take care ? ?Dr. Maitri Schnoebelen  ?

## 2022-12-10 NOTE — Progress Notes (Signed)
Subjective:  Patient ID: Brandi Oconnell, female    DOB: 07/18/1990  Age: 32 y.o. MRN: [id number]  CC: Chief Complaint  Patient presents with   Urinary Frequency   urinary discomfort     Symptoms started today    HPI:  32 year old female presents for evaluation of the above.  Patient reports that her symptoms started today.  She reports dysuria and urinary frequency.  She took a home test for UTI and it was abnormal.  She believes that she has urinary tract infection.  Has a history of urinary tract infection.  Denies abdominal pain.  No flank pain.  No fever.  No relieving factors.  Patient has had allergy to penicillin and states that she has had resistance to Macrobid.  Patient Active Problem List   Diagnosis Date Noted   UTI (urinary tract infection) 12/10/2022   GAD (generalized anxiety disorder) 07/22/2022   Obesity 01/27/2022   Flat affect 03/09/2021   Dyshidrotic eczema 12/08/2020   Depressive disorder 10/27/2020    Social Hx   Social History   Socioeconomic History   Marital status: Married    Spouse name: Not on file   Number of children: 1   Years of education: Not on file   Highest education level: Bachelor's degree (e.g., BA, AB, BS)  Occupational History   Not on file  Tobacco Use   Smoking status: Never   Smokeless tobacco: Never  Vaping Use   Vaping Use: Never used  Substance and Sexual Activity   Alcohol use: No   Drug use: No   Sexual activity: Yes  Other Topics Concern   Not on file  Social History Narrative   Not on file   Social Determinants of Health   Financial Resource Strain: Low Risk  (11/14/2022)   Overall Financial Resource Strain (CARDIA)    Difficulty of Paying Living Expenses: Not very hard  Food Insecurity: No Food Insecurity (11/14/2022)   Hunger Vital Sign    Worried About Running Out of Food in the Last Year: Never true    Ran Out of Food in the Last Year: Never true  Transportation Needs: No Transportation Needs  (11/14/2022)   PRAPARE - Administrator, Civil Service (Medical): No    Lack of Transportation (Non-Medical): No  Physical Activity: Sufficiently Active (11/14/2022)   Exercise Vital Sign    Days of Exercise per Week: 7 days    Minutes of Exercise per Session: 30 min  Stress: No Stress Concern Present (11/14/2022)   [employer] of Occupational Health - Occupational Stress Questionnaire    Feeling of Stress : Only a little  Social Connections: Moderately Integrated (11/14/2022)   Social Connection and Isolation Panel [NHANES]    Frequency of Communication with Friends and Family: More than three times a week    Frequency of Social Gatherings with Friends and Family: Three times a week    Attends Religious Services: 1 to 4 times per year    Active Member of Clubs or Organizations: No    Attends Engineer, structural: Not on file    Marital Status: Married    Review of Systems Per HPI  Objective:  BP (!) 102/57   Pulse 82   Temp 98.1 F (36.7 C)   Ht 5\' 5"  (1.651 m)   Wt 223 lb (101.2 kg)   LMP 10/14/2022   SpO2 98%   BMI 37.11 kg/m      12/10/2022    4:08  PM 11/17/2022    1:53 PM 07/22/2022   10:06 AM  BP/Weight  Systolic BP 102 126   Diastolic BP 57 77   Wt. (Lbs) 223 221   BMI 37.11 kg/m2 36.78 kg/m2      Information is confidential and restricted. Go to Review Flowsheets to unlock data.    Physical Exam Vitals and nursing note reviewed.  Constitutional:      Appearance: Normal appearance. She is obese.  HENT:     Head: Normocephalic and atraumatic.  Cardiovascular:     Rate and Rhythm: Normal rate and regular rhythm.  Pulmonary:     Effort: Pulmonary effort is normal.     Breath sounds: Normal breath sounds.  Abdominal:     General: There is no distension.     Palpations: Abdomen is soft.  Neurological:     Mental Status: She is alert.     Lab Results  Component Value Date   WBC 9.6 11/17/2022   HGB 15.6 11/17/2022   HCT 45.8  11/17/2022   PLT 336 11/17/2022   GLUCOSE 90 11/17/2022   CHOL 169 11/17/2022   TRIG 95 11/17/2022   HDL 47 11/17/2022   LDLCALC 104 (H) 11/17/2022   ALT 21 11/17/2022   AST 19 11/17/2022   NA 141 11/17/2022   K 4.2 11/17/2022   CL 103 11/17/2022   CREATININE 1.00 11/17/2022   BUN 11 11/17/2022   CO2 21 11/17/2022   TSH 0.749 07/22/2022   HGBA1C 5.5 07/10/2021     Assessment & Plan:   Problem List Items Addressed This Visit       Genitourinary   UTI (urinary tract infection) - Primary    UA consistent with UTI.  Sending culture.  Placing on Bactrim.      Relevant Medications   sulfamethoxazole-trimethoprim (BACTRIM DS) 800-160 MG tablet   Other Visit Diagnoses     Dysuria       Relevant Orders   POCT URINALYSIS DIP (CLINITEK) (Completed)   Urine Culture   Frequency of urination       Relevant Orders   POCT URINALYSIS DIP (CLINITEK) (Completed)   Urine Culture       Meds ordered this encounter  Medications   sulfamethoxazole-trimethoprim (BACTRIM DS) 800-160 MG tablet    Sig: Take 1 tablet by mouth 2 (two) times daily.    Dispense:  14 tablet    Refill:  0    Shaylene Paganelli DO [facility]

## 2022-12-14 LAB — URINE CULTURE

## 2023-01-29 DIAGNOSIS — R300 Dysuria: Secondary | ICD-10-CM | POA: Diagnosis not present

## 2023-03-16 ENCOUNTER — Encounter: Payer: Self-pay | Admitting: Family Medicine

## 2023-03-16 ENCOUNTER — Other Ambulatory Visit: Payer: Self-pay | Admitting: Nurse Practitioner

## 2023-03-17 ENCOUNTER — Other Ambulatory Visit: Payer: Self-pay | Admitting: Nurse Practitioner

## 2023-03-17 MED ORDER — MOMETASONE FUROATE 0.1 % EX CREA: TOPICAL_CREAM | Freq: Every day | CUTANEOUS | 0 refills | Status: AC

## 2023-03-26 DIAGNOSIS — K5900 Constipation, unspecified: Secondary | ICD-10-CM | POA: Diagnosis not present

## 2023-03-26 DIAGNOSIS — K219 Gastro-esophageal reflux disease without esophagitis: Secondary | ICD-10-CM | POA: Diagnosis not present

## 2023-03-26 DIAGNOSIS — K625 Hemorrhage of anus and rectum: Secondary | ICD-10-CM | POA: Diagnosis not present

## 2023-03-26 DIAGNOSIS — Z83719 Family history of colon polyps, unspecified: Secondary | ICD-10-CM | POA: Diagnosis not present

## 2023-04-16 DIAGNOSIS — K621 Rectal polyp: Secondary | ICD-10-CM | POA: Diagnosis not present

## 2023-04-16 DIAGNOSIS — K625 Hemorrhage of anus and rectum: Secondary | ICD-10-CM | POA: Diagnosis not present

## 2023-04-16 DIAGNOSIS — K640 First degree hemorrhoids: Secondary | ICD-10-CM | POA: Diagnosis not present

## 2023-04-22 ENCOUNTER — Other Ambulatory Visit: Payer: Self-pay | Admitting: Family Medicine

## 2023-05-15 ENCOUNTER — Ambulatory Visit: Payer: BC Managed Care – PPO | Admitting: Family Medicine

## 2023-05-25 ENCOUNTER — Other Ambulatory Visit: Payer: Self-pay | Admitting: Family Medicine

## 2023-07-03 DIAGNOSIS — Z6834 Body mass index (BMI) 34.0-34.9, adult: Secondary | ICD-10-CM | POA: Diagnosis not present

## 2023-07-03 DIAGNOSIS — Z01419 Encounter for gynecological examination (general) (routine) without abnormal findings: Secondary | ICD-10-CM | POA: Diagnosis not present

## 2023-09-24 DIAGNOSIS — Z113 Encounter for screening for infections with a predominantly sexual mode of transmission: Secondary | ICD-10-CM | POA: Diagnosis not present

## 2023-09-24 DIAGNOSIS — N760 Acute vaginitis: Secondary | ICD-10-CM | POA: Diagnosis not present

## 2024-01-07 DIAGNOSIS — Z881 Allergy status to other antibiotic agents status: Secondary | ICD-10-CM | POA: Diagnosis not present

## 2024-01-07 DIAGNOSIS — Z880 Allergy status to penicillin: Secondary | ICD-10-CM | POA: Diagnosis not present

## 2024-01-07 DIAGNOSIS — Z1329 Encounter for screening for other suspected endocrine disorder: Secondary | ICD-10-CM | POA: Diagnosis not present

## 2024-01-07 DIAGNOSIS — J302 Other seasonal allergic rhinitis: Secondary | ICD-10-CM | POA: Diagnosis not present

## 2024-01-07 DIAGNOSIS — D519 Vitamin B12 deficiency anemia, unspecified: Secondary | ICD-10-CM | POA: Diagnosis not present

## 2024-01-07 DIAGNOSIS — E039 Hypothyroidism, unspecified: Secondary | ICD-10-CM | POA: Diagnosis not present

## 2024-01-07 DIAGNOSIS — R5382 Chronic fatigue, unspecified: Secondary | ICD-10-CM | POA: Diagnosis not present

## 2024-01-07 DIAGNOSIS — E559 Vitamin D deficiency, unspecified: Secondary | ICD-10-CM | POA: Diagnosis not present

## 2024-01-07 DIAGNOSIS — E663 Overweight: Secondary | ICD-10-CM | POA: Diagnosis not present

## 2024-01-07 DIAGNOSIS — Z131 Encounter for screening for diabetes mellitus: Secondary | ICD-10-CM | POA: Diagnosis not present

## 2024-01-07 DIAGNOSIS — E611 Iron deficiency: Secondary | ICD-10-CM | POA: Diagnosis not present

## 2024-01-07 DIAGNOSIS — Z733 Stress, not elsewhere classified: Secondary | ICD-10-CM | POA: Diagnosis not present

## 2024-03-01 DIAGNOSIS — N911 Secondary amenorrhea: Secondary | ICD-10-CM | POA: Diagnosis not present

## 2024-03-02 DIAGNOSIS — R5382 Chronic fatigue, unspecified: Secondary | ICD-10-CM | POA: Diagnosis not present

## 2024-03-02 DIAGNOSIS — D519 Vitamin B12 deficiency anemia, unspecified: Secondary | ICD-10-CM | POA: Diagnosis not present

## 2024-03-02 DIAGNOSIS — E611 Iron deficiency: Secondary | ICD-10-CM | POA: Diagnosis not present

## 2024-03-02 DIAGNOSIS — E039 Hypothyroidism, unspecified: Secondary | ICD-10-CM | POA: Diagnosis not present

## 2024-03-17 DIAGNOSIS — Z3685 Encounter for antenatal screening for Streptococcus B: Secondary | ICD-10-CM | POA: Diagnosis not present

## 2024-03-17 DIAGNOSIS — Z3481 Encounter for supervision of other normal pregnancy, first trimester: Secondary | ICD-10-CM | POA: Diagnosis not present

## 2024-03-17 DIAGNOSIS — Z3A09 9 weeks gestation of pregnancy: Secondary | ICD-10-CM | POA: Diagnosis not present

## 2024-03-22 DIAGNOSIS — Z113 Encounter for screening for infections with a predominantly sexual mode of transmission: Secondary | ICD-10-CM | POA: Diagnosis not present

## 2024-03-22 DIAGNOSIS — N760 Acute vaginitis: Secondary | ICD-10-CM | POA: Diagnosis not present

## 2024-03-22 DIAGNOSIS — Z3481 Encounter for supervision of other normal pregnancy, first trimester: Secondary | ICD-10-CM | POA: Diagnosis not present

## 2024-03-22 DIAGNOSIS — Z3A09 9 weeks gestation of pregnancy: Secondary | ICD-10-CM | POA: Diagnosis not present

## 2024-05-31 DIAGNOSIS — Z3A19 19 weeks gestation of pregnancy: Secondary | ICD-10-CM | POA: Diagnosis not present

## 2024-05-31 DIAGNOSIS — Z363 Encounter for antenatal screening for malformations: Secondary | ICD-10-CM | POA: Diagnosis not present

## 2024-05-31 DIAGNOSIS — Z3482 Encounter for supervision of other normal pregnancy, second trimester: Secondary | ICD-10-CM | POA: Diagnosis not present

## 2024-05-31 DIAGNOSIS — Z361 Encounter for antenatal screening for raised alphafetoprotein level: Secondary | ICD-10-CM | POA: Diagnosis not present

## 2024-10-13 ENCOUNTER — Encounter (HOSPITAL_COMMUNITY): Payer: Self-pay

## 2024-10-13 ENCOUNTER — Inpatient Hospital Stay (HOSPITAL_COMMUNITY): Admit: 2024-10-13 | Admitting: Obstetrics and Gynecology
